# Patient Record
Sex: Female | Born: 1955 | Race: White | Hispanic: No | Marital: Married | Smoking: Never smoker
Health system: Southern US, Community
[De-identification: ages and names within clinical notes are randomized; demographics above are authoritative.]

## PROBLEM LIST (undated history)

## (undated) DIAGNOSIS — E785 Hyperlipidemia, unspecified: Secondary | ICD-10-CM

## (undated) DIAGNOSIS — K635 Polyp of colon: Secondary | ICD-10-CM

## (undated) HISTORY — PX: OTHER SURGICAL HISTORY: SHX169

## (undated) HISTORY — PX: TONSILLECTOMY: SUR1361

## (undated) HISTORY — DX: Polyp of colon: K63.5

## (undated) HISTORY — PX: ABDOMINAL HYSTERECTOMY: SHX81

## (undated) HISTORY — PX: OOPHORECTOMY: SHX86

## (undated) HISTORY — DX: Hyperlipidemia, unspecified: E78.5

---

## 2001-03-14 ENCOUNTER — Other Ambulatory Visit: Admission: RE | Admit: 2001-03-14 | Discharge: 2001-03-14 | Payer: Self-pay | Admitting: Obstetrics and Gynecology

## 2001-03-21 ENCOUNTER — Encounter: Admission: RE | Admit: 2001-03-21 | Discharge: 2001-03-21 | Payer: Self-pay | Admitting: Obstetrics and Gynecology

## 2001-03-21 ENCOUNTER — Encounter: Payer: Self-pay | Admitting: Obstetrics and Gynecology

## 2003-11-12 ENCOUNTER — Encounter: Admission: RE | Admit: 2003-11-12 | Discharge: 2003-11-12 | Payer: Self-pay | Admitting: Emergency Medicine

## 2003-11-13 ENCOUNTER — Encounter: Admission: RE | Admit: 2003-11-13 | Discharge: 2003-11-13 | Payer: Self-pay | Admitting: Emergency Medicine

## 2005-01-12 ENCOUNTER — Encounter: Admission: RE | Admit: 2005-01-12 | Discharge: 2005-01-12 | Payer: Self-pay | Admitting: Emergency Medicine

## 2005-02-23 ENCOUNTER — Encounter (INDEPENDENT_AMBULATORY_CARE_PROVIDER_SITE_OTHER): Payer: Self-pay | Admitting: *Deleted

## 2005-02-23 ENCOUNTER — Ambulatory Visit (HOSPITAL_COMMUNITY): Admission: RE | Admit: 2005-02-23 | Discharge: 2005-02-23 | Payer: Self-pay | Admitting: Gastroenterology

## 2007-08-01 ENCOUNTER — Encounter: Admission: RE | Admit: 2007-08-01 | Discharge: 2007-08-01 | Payer: Self-pay | Admitting: Emergency Medicine

## 2007-08-02 ENCOUNTER — Encounter: Admission: RE | Admit: 2007-08-02 | Discharge: 2007-08-02 | Payer: Self-pay | Admitting: Emergency Medicine

## 2010-03-11 ENCOUNTER — Encounter: Admission: RE | Admit: 2010-03-11 | Discharge: 2010-03-11 | Payer: Self-pay | Admitting: Family Medicine

## 2010-03-17 ENCOUNTER — Ambulatory Visit (HOSPITAL_COMMUNITY): Admission: RE | Admit: 2010-03-17 | Discharge: 2010-03-18 | Payer: Self-pay | Admitting: General Surgery

## 2010-07-30 LAB — COMPREHENSIVE METABOLIC PANEL
AST: 29 U/L (ref 0–37)
Albumin: 4.1 g/dL (ref 3.5–5.2)
Alkaline Phosphatase: 62 U/L (ref 39–117)
CO2: 30 mEq/L (ref 19–32)
Calcium: 9.2 mg/dL (ref 8.4–10.5)
Creatinine, Ser: 0.94 mg/dL (ref 0.4–1.2)
GFR calc Af Amer: >60 mL/min (ref >60)
GFR calc non Af Amer: >60 mL/min (ref >60)
Glucose, Bld: 82 mg/dL (ref 70–99)
Sodium: 140 mEq/L (ref 135–145)
Total Protein: 6.3 g/dL (ref 6.0–8.3)

## 2010-07-30 LAB — DIFFERENTIAL
Basophils Absolute: 0 10*3/uL (ref 0.0–0.1)
Basophils Relative: 1 % (ref 0–1)
Eosinophils Absolute: 0.1 10*3/uL (ref 0.0–0.7)
Lymphocytes Relative: 33 % (ref 12–46)
Monocytes Relative: 7 % (ref 3–12)
Neutro Abs: 3.4 10*3/uL (ref 1.7–7.7)

## 2010-07-30 LAB — CBC
Hemoglobin: 13.4 g/dL (ref 12.0–15.0)
MCH: 33.5 pg (ref 26.0–34.0)
MCHC: 34.6 g/dL (ref 30.0–36.0)
MCV: 96.8 fL (ref 78.0–100.0)
Platelets: 255 10*3/uL (ref 150–400)
RBC: 4 MIL/uL (ref 3.87–5.11)

## 2010-10-03 NOTE — Op Note (Signed)
NAMEALANEY, Christina Caldwell               ACCOUNT NO.:  [id number]   MEDICAL RECORD NO.:  [id number]          PATIENT TYPE:  AMB   LOCATION:  ENDO                         FACILITY:  [facility]   PHYSICIAN:  Anselmo Rod, M.D.  DATE OF BIRTH:  Nov 20, 1955   DATE OF PROCEDURE:  02/27/2005  DATE OF DISCHARGE:  02/23/2005                                 OPERATIVE REPORT   PROCEDURE:  Colonoscopy with snare polypectomy x1.   ENDOSCOPIST:  Anselmo Rod, M.D.   INSTRUMENT USED:  Olympus video colonoscope.   INDICATIONS FOR PROCEDURE:  This 55 year old female underwent a screening  colonoscopy to rule out colonic polyps, masses, etc.   PRE-PROCEDURE PREPARATION:  An informed consent was procured from the  patient.  The patient was fasted for eight hours prior to the procedure and  prepped with Osmo-Prep pills the night or in the morning of the procedure.  The risks and benefits of the procedure, including a 10% miss rate of cancer  and polyps, was discussed with her as well.   PRE-PROCEDURE PHYSICAL EXAMINATION:  VITAL SIGNS:  Stable.  NECK:  Supple.  CHEST:  Clear to auscultation.  HEART:  S1 and S2 regular.  ABDOMEN:  Soft, with normal bowel sounds.   DESCRIPTION OF PROCEDURE:  The patient was placed in the left lateral  decubitus position and sedated with 60 mg of Demerol and 6 mg of Versed in  slow incremental doses.  Once the patient was adequately sedated and  maintained on low-flow oxygen and continuous cardiac monitoring, the Olympus  video colonoscope was advanced from the rectum to the cecum.  A small  sessile polyp was removed by a hot snare from 80 cm.  The rest of the exam  up to the terminal ileum was normal.   IMPRESSION:  1.  Small sessile polyp, removed by a hot snare from 80 cm.  2.  Otherwise normal colonoscopy up to the terminal ileum.  No large masses,      polyps or diverticula were seen.   RECOMMENDATIONS:  1.  Continue a high-fiber diet with liberal fluid  intake.  2.  Await pathology results.  3.  Avoid non-steroidals including aspirin for the next two weeks.  4.  Repeat colonoscopy, depending upon the pathology results.  5.  Outpatient followup as the need arises in the future.      Anselmo Rod, M.D.  Electronically Signed     JNM/MEDQ  D:  02/27/2005  T:  02/27/2005  Job:  [id number]   cc:   Reuben Likes, M.D.  Fax: [phone]

## 2012-02-09 ENCOUNTER — Other Ambulatory Visit: Payer: Self-pay | Admitting: Family Medicine

## 2012-02-09 DIAGNOSIS — Z1231 Encounter for screening mammogram for malignant neoplasm of breast: Secondary | ICD-10-CM

## 2012-02-23 ENCOUNTER — Ambulatory Visit: Payer: Self-pay

## 2012-03-08 ENCOUNTER — Ambulatory Visit
Admission: RE | Admit: 2012-03-08 | Discharge: 2012-03-08 | Disposition: A | Discharge status: Home / Self Care | Payer: PRIVATE HEALTH INSURANCE | Source: Ambulatory Visit | Attending: Family Medicine | Admitting: Family Medicine

## 2012-03-08 DIAGNOSIS — Z1231 Encounter for screening mammogram for malignant neoplasm of breast: Secondary | ICD-10-CM

## 2012-11-07 ENCOUNTER — Encounter: Payer: Self-pay | Admitting: Women's Health

## 2012-11-07 ENCOUNTER — Ambulatory Visit (INDEPENDENT_AMBULATORY_CARE_PROVIDER_SITE_OTHER): Payer: PRIVATE HEALTH INSURANCE | Admitting: Women's Health

## 2012-11-07 VITALS — BP 130/80 | Ht 65.5 in | Wt 198.0 lb

## 2012-11-07 DIAGNOSIS — Z780 Asymptomatic menopausal state: Secondary | ICD-10-CM

## 2012-11-07 DIAGNOSIS — N898 Other specified noninflammatory disorders of vagina: Secondary | ICD-10-CM

## 2012-11-07 DIAGNOSIS — Z01419 Encounter for gynecological examination (general) (routine) without abnormal findings: Secondary | ICD-10-CM

## 2012-11-07 DIAGNOSIS — N952 Postmenopausal atrophic vaginitis: Secondary | ICD-10-CM | POA: Insufficient documentation

## 2012-11-07 DIAGNOSIS — N899 Noninflammatory disorder of vagina, unspecified: Secondary | ICD-10-CM

## 2012-11-07 LAB — WET PREP FOR TRICH, YEAST, CLUE
Clue Cells Wet Prep HPF POC: NONE SEEN
Trich, Wet Prep: NONE SEEN
Yeast Wet Prep HPF POC: NONE SEEN

## 2012-11-07 NOTE — Progress Notes (Signed)
Christina Caldwell Jan 13, 1956 [id number]    History:    The patient presents for annual exam.  Postmenopausal no HRT. Hysterectomy from uterine rupture with pregnancy.  Normal Pap and mammogram history. Negative polyp on colonoscopy at age 57 was instructed to repeat colonoscopy at 75.   Past medical history, past surgical history, family history and social history were all reviewed and documented in the EPIC chart. Cholecystectomy 02/2010. 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 Husband currently out of work. Son died of Ewing's sarcoma at 25. Has an adopted son who was in the [employer], now out with some injuries.   ROS:  A  ROS was performed and pertinent positives and negatives are included in the history.  Exam:  Filed Vitals:   11/07/12 1438  BP: 130/80    General appearance:  Normal Head/Neck:  Normal, without cervical or supraclavicular adenopathy. Thyroid:  Symmetrical, normal in size, without palpable masses or nodularity. Respiratory  Effort:  Normal  Auscultation:  Clear without wheezing or rhonchi Cardiovascular  Auscultation:  Regular rate, without rubs, murmurs or gallops  Edema/varicosities:  Not grossly evident Abdominal  Soft,nontender, without masses, guarding or rebound.  Liver/spleen:  No organomegaly noted  Hernia:  None appreciated  Skin  Inspection:  Grossly normal  Palpation:  Grossly normal Neurologic/psychiatric  Orientation:  Normal with appropriate conversation.  Mood/affect:  Normal  Genitourinary    Breasts: Examined lying and sitting.     Right: Without masses, retractions, discharge or axillary adenopathy.     Left: Without masses, retractions, discharge or axillary adenopathy.   Inguinal/mons:  Normal without inguinal adenopathy  External genitalia:  Normal  BUS/Urethra/Skene's glands:  Normal  Bladder:  Normal  Vagina:  atrophic  Cervix:  absent Uterus: absent  Adnexa/parametria:     Rt: Without masses or  tenderness.   Lt: Without masses or tenderness.  Anus and perineum: Normal  Digital rectal exam: Normal sphincter tone without palpated masses or tenderness  Assessment/Plan:  57 y.o. M WF G2 P1 for annual exam with vaginal burning and irritation especially with intercourse.  Vaginal atrophy Obesity  Plan: Continue care and labs with primary care. SBE's, annual mammogram, calcium rich diet, vitamin D 2000 daily.  DEXA will schedule here. Options reviewed for vaginal atrophy, Vagifem one applicator at bedtime for 2 weeks and then twice weekly for several weeks, vaginal lubricants encouraged states does not want to use long term samples given will call if would like a refill. Reviewed minimal systemic absorption, slight risk for blood clots and strokes. Increase regular exercise, decrease calories for weight loss for health.    Harrington Challenger [facility], 4:04 PM 11/07/2012

## 2012-11-07 NOTE — Patient Instructions (Signed)
Health Recommendations for Postmenopausal Women Respected and ongoing research has looked at the most common causes of death, disability, and poor quality of life in postmenopausal women. The causes include heart disease, diseases of blood vessels, diabetes, depression, cancer, and bone loss (osteoporosis). Many things can be done to help lower the chances of developing these and other common problems: CARDIOVASCULAR DISEASE Heart Disease: A heart attack is a medical emergency. Know the signs and symptoms of a heart attack. Below are things women can do to reduce their risk for heart disease.   Do not smoke. If you smoke, quit.  Aim for a healthy weight. Being overweight causes many preventable deaths. Eat a healthy and balanced diet and drink an adequate amount of liquids.  Get moving. Make a commitment to be more physically active. Aim for 30 minutes of activity on most, if not all days of the week.  Eat for heart health. Choose a diet that is low in saturated fat and cholesterol and eliminate trans fat. Include whole grains, vegetables, and fruits. Read and understand the labels on food containers before buying.  Know your numbers. Ask your caregiver to check your blood pressure, cholesterol (total, HDL, LDL, triglycerides) and blood glucose. Work with your caregiver on improving your entire clinical picture.  High blood pressure. Limit or stop your table salt intake (try salt substitute and food seasonings). Avoid salty foods and drinks. Read labels on food containers before buying. Eating well and exercising can help control high blood pressure. STROKE  Stroke is a medical emergency. Stroke may be the result of a blood clot in a blood vessel in the brain or by a brain hemorrhage (bleeding). Know the signs and symptoms of a stroke. To lower the risk of developing a stroke:  Avoid fatty foods.  Quit smoking.  Control your diabetes, blood pressure, and irregular heart rate. THROMBOPHLEBITIS  (BLOOD CLOT) OF THE LEG  Becoming overweight and leading a stationary lifestyle may also contribute to developing blood clots. Controlling your diet and exercising will help lower the risk of developing blood clots. CANCER SCREENING  Breast Cancer: Take steps to reduce your risk of breast cancer.  You should practice "breast self-awareness." This means understanding the normal appearance and feel of your breasts and should include breast self-examination. Any changes detected, no matter how small, should be reported to your caregiver.  After age 40, you should have a clinical breast exam (CBE) every year.  Starting at age 40, you should consider having a mammogram (breast X-ray) every year.  If you have a family history of breast cancer, talk to your caregiver about genetic screening.  If you are at high risk for breast cancer, talk to your caregiver about having an MRI and a mammogram every year.  Intestinal or Stomach Cancer: Tests to consider are a rectal exam, fecal occult blood, sigmoidoscopy, and colonoscopy. Women who are high risk may need to be screened at an earlier age and more often.  Cervical Cancer:  Beginning at age 30, you should have a Pap test every 3 years as long as the past 3 Pap tests have been normal.  If you have had past treatment for cervical cancer or a condition that could lead to cancer, you need Pap tests and screening for cancer for at least 20 years after your treatment.  If you had a hysterectomy for a problem that was not cancer or a condition that could lead to cancer, then you no longer need Pap tests.    If you are between ages 65 and 70, and you have had normal Pap tests going back 10 years, you no longer need Pap tests.  If Pap tests have been discontinued, risk factors (such as a new sexual partner) need to be reassessed to determine if screening should be resumed.  Some medical problems can increase the chance of getting cervical cancer. In these  cases, your caregiver may recommend more frequent screening and Pap tests.  Uterine Cancer: If you have vaginal bleeding after reaching menopause, you should notify your caregiver.  Ovarian cancer: Other than yearly pelvic exams, there are no reliable tests available to screen for ovarian cancer at this time except for yearly pelvic exams.  Lung Cancer: Yearly chest X-rays can detect lung cancer and should be done on high risk women, such as cigarette smokers and women with chronic lung disease (emphysema).  Skin Cancer: A complete body skin exam should be done at your yearly examination. Avoid overexposure to the sun and ultraviolet light lamps. Use a strong sun block cream when in the sun. All of these things are important in lowering the risk of skin cancer. MENOPAUSE Menopause Symptoms: Hormone therapy products are effective for treating symptoms associated with menopause:  Moderate to severe hot flashes.  Night sweats.  Mood swings.  Headaches.  Tiredness.  Loss of sex drive.  Insomnia.  Other symptoms. Hormone replacement carries certain risks, especially in older women. Women who use or are thinking about using estrogen or estrogen with progestin treatments should discuss that with their caregiver. Your caregiver will help you understand the benefits and risks. The ideal dose of hormone replacement therapy is not known. The Food and Drug Administration (FDA) has concluded that hormone therapy should be used only at the lowest doses and for the shortest amount of time to reach treatment goals.  OSTEOPOROSIS Protecting Against Bone Loss and Preventing Fracture: If you use hormone therapy for prevention of bone loss (osteoporosis), the risks for bone loss must outweigh the risk of the therapy. Ask your caregiver about other medications known to be safe and effective for preventing bone loss and fractures. To guard against bone loss or fractures, the following is recommended:  If  you are less than age 50, take 1000 mg of calcium and at least 600 mg of Vitamin D per day.  If you are greater than age 50 but less than age 70, take 1200 mg of calcium and at least 600 mg of Vitamin D per day.  If you are greater than age 70, take 1200 mg of calcium and at least 800 mg of Vitamin D per day. Smoking and excessive alcohol intake increases the risk of osteoporosis. Eat foods rich in calcium and vitamin D and do weight bearing exercises several times a week as your caregiver suggests. DIABETES Diabetes Melitus: If you have Type I or Type 2 diabetes, you should keep your blood sugar under control with diet, exercise and recommended medication. Avoid too many sweets, starchy and fatty foods. Being overweight can make control more difficult. COGNITION AND MEMORY Cognition and Memory: Menopausal hormone therapy is not recommended for the prevention of cognitive disorders such as Alzheimer's disease or memory loss.  DEPRESSION  Depression may occur at any age, but is common in elderly women. The reasons may be because of physical, medical, social (loneliness), or financial problems and needs. If you are experiencing depression because of medical problems and control of symptoms, talk to your caregiver about this. Physical activity and   exercise may help with mood and sleep. Community and volunteer involvement may help your sense of value and worth. If you have depression and you feel that the problem is getting worse or becoming severe, talk to your caregiver about treatment options that are best for you. ACCIDENTS  Accidents are common and can be serious in the elderly woman. Prepare your house to prevent accidents. Eliminate throw rugs, place hand bars in the bath, shower and toilet areas. Avoid wearing high heeled shoes or walking on wet, snowy, and icy areas. Limit or stop driving if you have vision or hearing problems, or you feel you are unsteady with you movements and  reflexes. HEPATITIS C Hepatitis C is a type of viral infection affecting the liver. It is spread mainly through contact with blood from an infected person. It can be treated, but if left untreated, it can lead to severe liver damage over years. Many people who are infected do not know that the virus is in their blood. If you are a "baby-boomer", it is recommended that you have one screening test for Hepatitis C. IMMUNIZATIONS  Several immunizations are important to consider having during your senior years, including:   Tetanus, diptheria, and pertussis booster shot.  Influenza every year before the flu season begins.  Pneumonia vaccine.  Shingles vaccine.  Others as indicated based on your specific needs. Talk to your caregiver about these. Document Released: 06/26/2005 Document Revised: 04/20/2012 Document Reviewed: 02/20/2008 Exit[employer] Patient Information 2014 [employer].  

## 2014-03-19 ENCOUNTER — Encounter: Payer: Self-pay | Admitting: Women's Health

## 2014-10-16 ENCOUNTER — Other Ambulatory Visit: Payer: Self-pay

## 2014-10-16 DIAGNOSIS — Z1231 Encounter for screening mammogram for malignant neoplasm of breast: Secondary | ICD-10-CM

## 2014-10-22 ENCOUNTER — Encounter (INDEPENDENT_AMBULATORY_CARE_PROVIDER_SITE_OTHER): Payer: Self-pay

## 2014-10-22 ENCOUNTER — Ambulatory Visit
Admission: RE | Admit: 2014-10-22 | Discharge: 2014-10-22 | Disposition: A | Discharge status: Home / Self Care | Payer: PRIVATE HEALTH INSURANCE | Source: Ambulatory Visit

## 2014-10-22 DIAGNOSIS — Z1231 Encounter for screening mammogram for malignant neoplasm of breast: Secondary | ICD-10-CM

## 2015-09-30 ENCOUNTER — Other Ambulatory Visit: Payer: Self-pay

## 2015-09-30 DIAGNOSIS — Z1231 Encounter for screening mammogram for malignant neoplasm of breast: Secondary | ICD-10-CM

## 2015-11-15 ENCOUNTER — Ambulatory Visit
Admission: RE | Admit: 2015-11-15 | Discharge: 2015-11-15 | Disposition: A | Discharge status: Home / Self Care | Payer: PRIVATE HEALTH INSURANCE | Source: Ambulatory Visit

## 2015-11-15 DIAGNOSIS — Z1231 Encounter for screening mammogram for malignant neoplasm of breast: Secondary | ICD-10-CM

## 2017-11-10 ENCOUNTER — Other Ambulatory Visit: Payer: Self-pay | Admitting: Family Medicine

## 2017-11-10 DIAGNOSIS — Z1231 Encounter for screening mammogram for malignant neoplasm of breast: Secondary | ICD-10-CM

## 2017-12-02 ENCOUNTER — Ambulatory Visit
Admission: RE | Admit: 2017-12-02 | Discharge: 2017-12-02 | Disposition: A | Discharge status: Home / Self Care | Payer: Self-pay | Source: Ambulatory Visit | Attending: Family Medicine | Admitting: Family Medicine

## 2017-12-02 DIAGNOSIS — Z1231 Encounter for screening mammogram for malignant neoplasm of breast: Secondary | ICD-10-CM

## 2019-08-15 ENCOUNTER — Ambulatory Visit: Payer: Self-pay | Admitting: Family Medicine

## 2019-08-15 ENCOUNTER — Encounter: Payer: Self-pay | Admitting: Family Medicine

## 2019-08-15 VITALS — BP 120/78 | HR 68 | Ht 66.0 in | Wt 205.0 lb

## 2019-08-15 DIAGNOSIS — Z789 Other specified health status: Secondary | ICD-10-CM

## 2019-08-15 NOTE — Progress Notes (Signed)
  Subjective:     Patient ID: Christina Caldwell, female   DOB: [birth year], 64 y.o.   MRN: [id number]  HPI Christina Caldwell [facility] for her required wellness visit for her insurance. Her PCP is Dr. Sigmund Hazel, she is scheduled for her yearly physical in June per patient. She is due for her mammogram. She states she is also due for an eye exam and a skin cancer screening. She reports that she did the cologuard screening 5 years ago. She has had a hysterectomy, so no pap smear is needed. She is a non-smoker. She states she is very active at work, could do better at eating healthier options. Struggles with sleep at times d/t arthritis pain, but does not like to take any medications for this unless absolutely necessary. She denies any problems or concerns today.  Past Medical History:  Diagnosis Date  . Colon polyps    Allergies  Allergen Reactions  . Benadryl [Diphenhydramine Hcl]     Current Outpatient Medications:  Marland Kitchen  Vitamin D, Ergocalciferol, (DRISDOL) 1.25 MG (50000 UNIT) CAPS capsule, Take 50,000 Units by mouth 2 (two) times a week., Disp: , Rfl:     Review of Systems  Constitutional: Negative for chills, fatigue, fever and unexpected weight change.  HENT: Negative for congestion, ear pain, sinus pressure, sinus pain and sore throat.   Eyes: Negative for discharge and visual disturbance.  Respiratory: Negative for cough, shortness of breath and wheezing.   Cardiovascular: Negative for chest pain and leg swelling.  Gastrointestinal: Negative for abdominal pain, blood in stool, constipation, diarrhea, nausea and vomiting.  Genitourinary: Negative for difficulty urinating and hematuria.  Skin: Negative for color change.  Neurological: Negative for dizziness, weakness, light-headedness and headaches.  Hematological: Negative for adenopathy.  All other systems reviewed and are negative.      Objective:   Physical Exam Vitals reviewed.  Constitutional:    General: She is not in acute distress.    Appearance: Normal appearance. She is well-developed.  HENT:     Head: Normocephalic and atraumatic.  Eyes:     General:        Right eye: No discharge.        Left eye: No discharge.  Cardiovascular:     Rate and Rhythm: Normal rate and regular rhythm.     Heart sounds: Normal heart sounds.  Pulmonary:     Effort: Pulmonary effort is normal. No respiratory distress.     Breath sounds: Normal breath sounds.  Musculoskeletal:     Cervical back: Neck supple.  Skin:    General: Skin is warm and dry.  Neurological:     Mental Status: She is alert and oriented to person, place, and time.  Psychiatric:        Mood and Affect: Mood normal.        Behavior: Behavior normal.    Today's Vitals   08/15/19 1401  BP: 120/78  Pulse: 68  SpO2: 98%  Weight: 205 lb (93 kg)  Height: 5\' 6"  (1.676 m)   Body mass index is 33.09 kg/m.      Assessment:     Participant in health and wellness plan      Plan:     1. Keep all regular appts with PCP. Ensure mammogram and eye exam are scheduled for this year.  2. Encouraged efforts at healthy eating and increasing physical activity for weight loss. 3. F/u here prn.

## 2019-09-08 ENCOUNTER — Other Ambulatory Visit: Payer: Self-pay | Admitting: Family Medicine

## 2019-09-08 DIAGNOSIS — Z1231 Encounter for screening mammogram for malignant neoplasm of breast: Secondary | ICD-10-CM

## 2019-09-14 ENCOUNTER — Ambulatory Visit
Admission: RE | Admit: 2019-09-14 | Discharge: 2019-09-14 | Disposition: A | Discharge status: Home / Self Care | Payer: PRIVATE HEALTH INSURANCE | Source: Ambulatory Visit | Attending: Family Medicine | Admitting: Family Medicine

## 2019-09-14 ENCOUNTER — Other Ambulatory Visit: Payer: Self-pay

## 2019-09-14 DIAGNOSIS — Z1231 Encounter for screening mammogram for malignant neoplasm of breast: Secondary | ICD-10-CM

## 2019-10-26 ENCOUNTER — Other Ambulatory Visit: Payer: Self-pay | Admitting: Family Medicine

## 2019-10-26 DIAGNOSIS — E2839 Other primary ovarian failure: Secondary | ICD-10-CM

## 2020-01-12 ENCOUNTER — Ambulatory Visit
Admission: RE | Admit: 2020-01-12 | Discharge: 2020-01-12 | Disposition: A | Discharge status: Home / Self Care | Payer: PRIVATE HEALTH INSURANCE | Source: Ambulatory Visit | Attending: Family Medicine | Admitting: Family Medicine

## 2020-01-12 ENCOUNTER — Other Ambulatory Visit: Payer: Self-pay

## 2020-01-12 DIAGNOSIS — E2839 Other primary ovarian failure: Secondary | ICD-10-CM

## 2020-12-17 IMAGING — MG DIGITAL SCREENING BILAT W/ CAD
5 series · 5 of 5 positions shown · non-contrast
Comparison: Previous exam(s).

CLINICAL DATA: Screening.

EXAM:
DIGITAL SCREENING BILATERAL MAMMOGRAM WITH CAD

[R CC (1 of 2)]
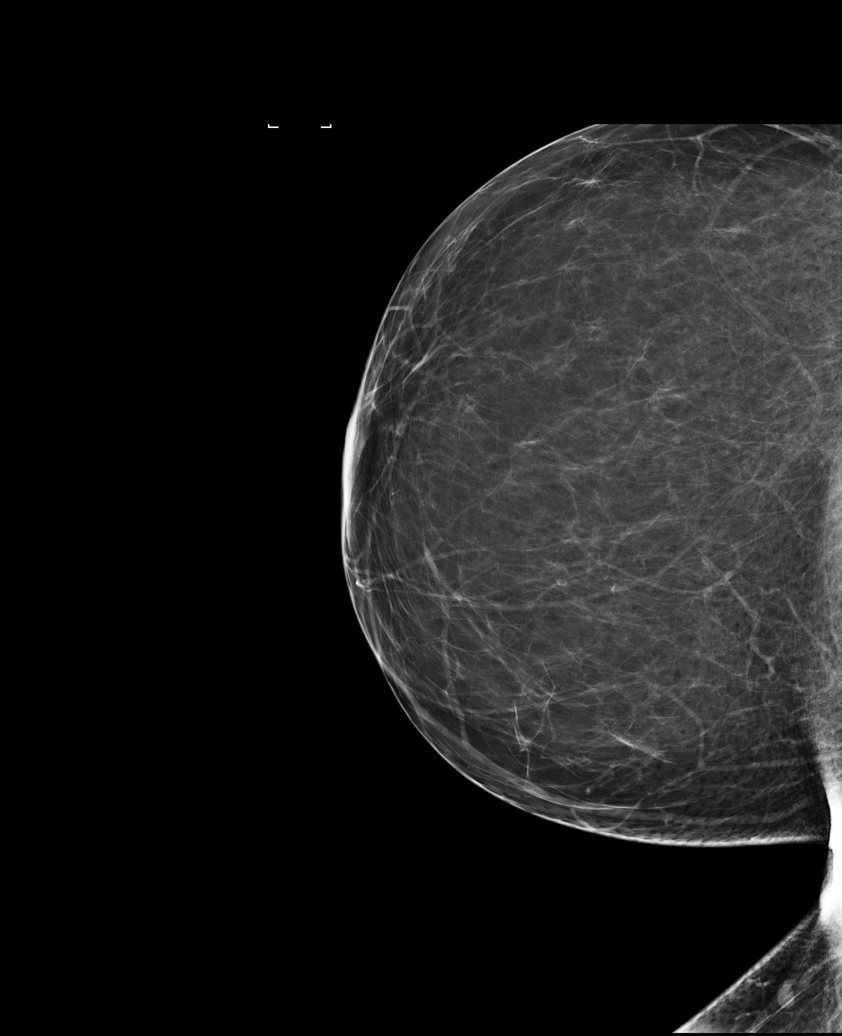

[R MLO]
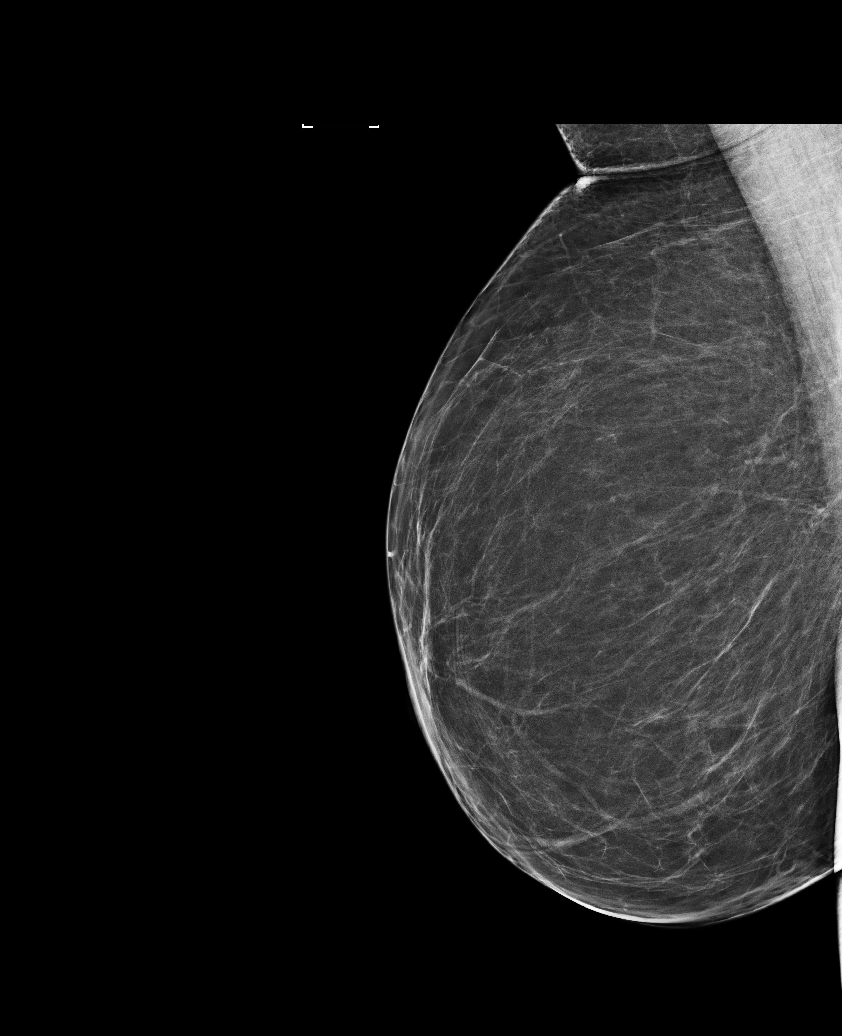

[L CC]
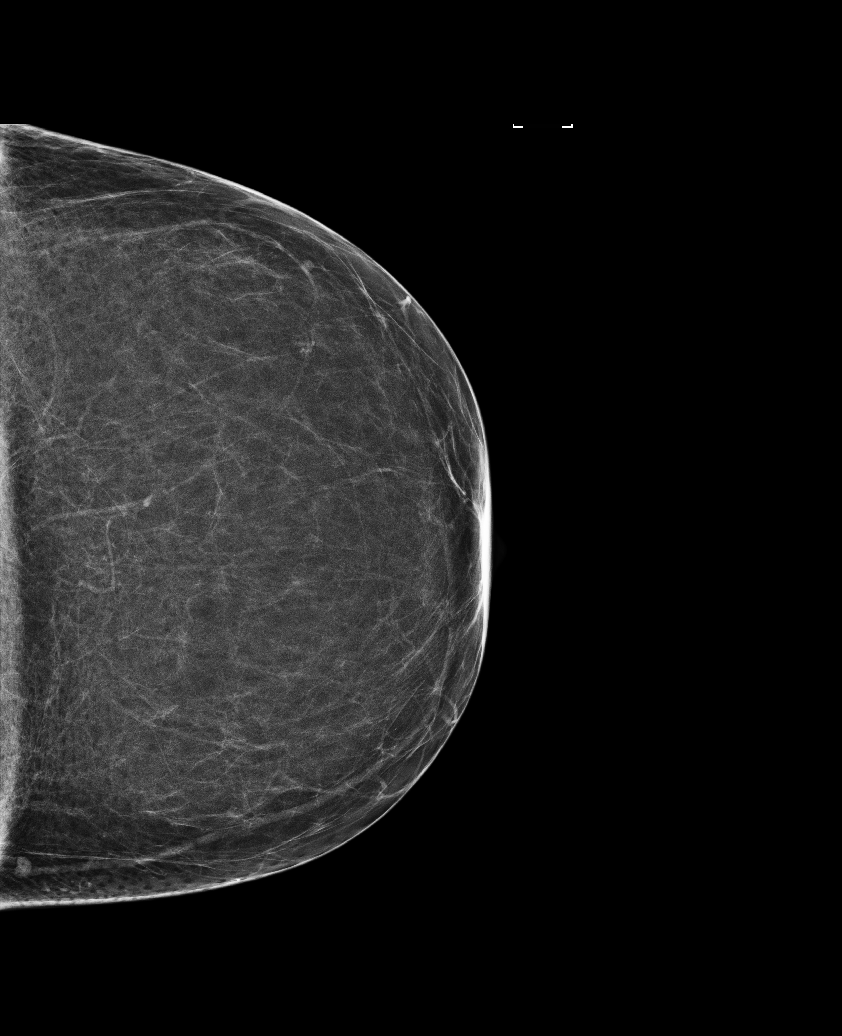

[R CC (2 of 2)]
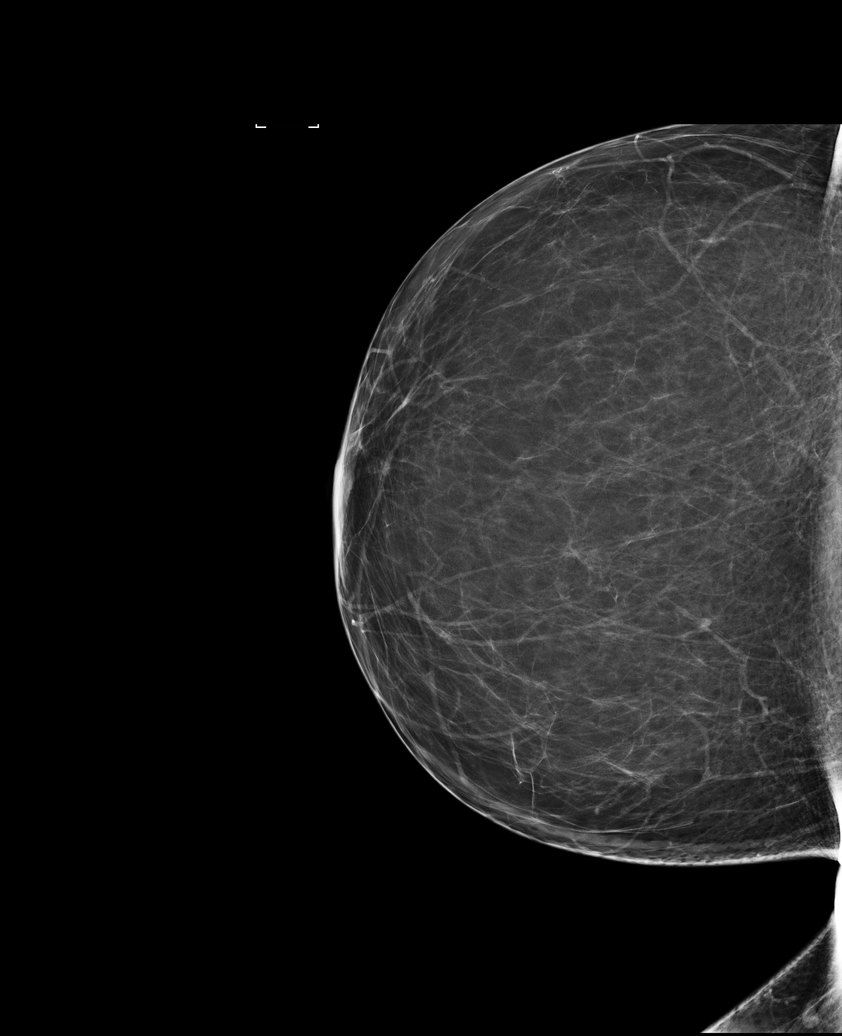

[L MLO]
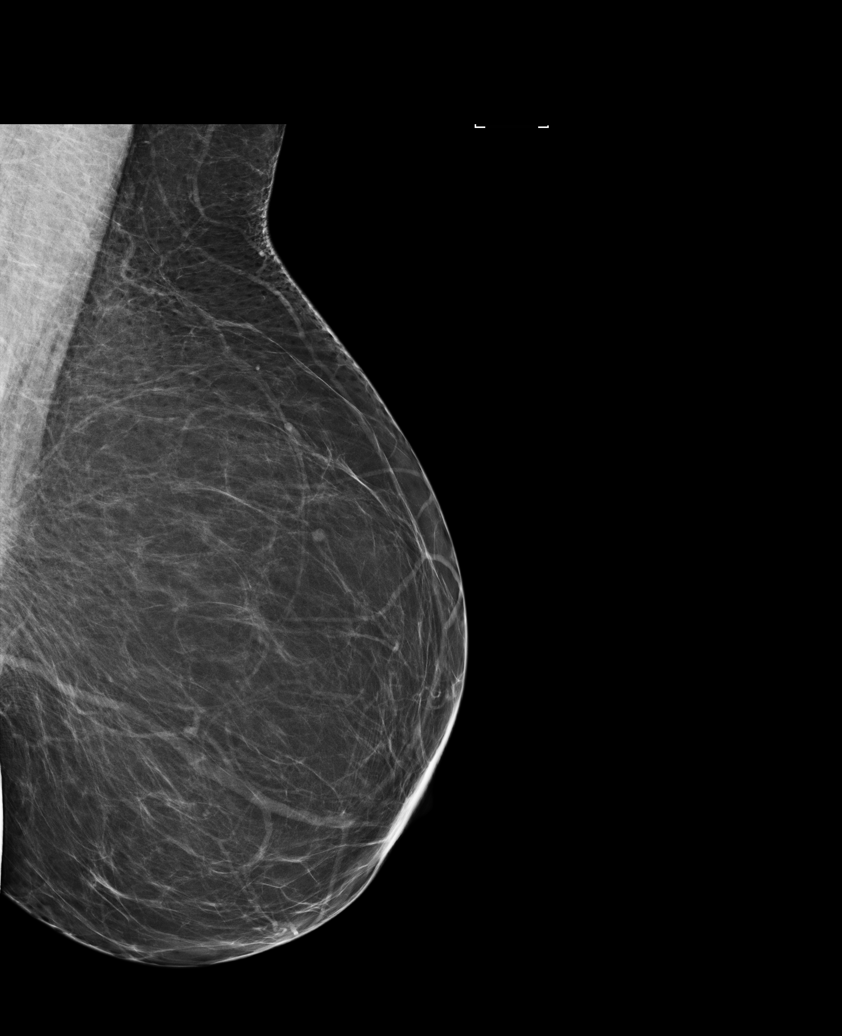

[5 of 5 positions shown; findings below may reference images not displayed]

ACR Breast Density Category b: There are scattered areas of
fibroglandular density.
FINDINGS: There are no findings suspicious for malignancy. Images were
processed with CAD.
IMPRESSION: No mammographic evidence of malignancy. A result letter of this
screening mammogram will be mailed directly to the patient.

RECOMMENDATION:
Screening mammogram in one year. (Code:AS-G-LCT)

BI-RADS CATEGORY  1: Negative.

## 2021-01-09 ENCOUNTER — Encounter (HOSPITAL_BASED_OUTPATIENT_CLINIC_OR_DEPARTMENT_OTHER): Payer: Self-pay | Admitting: Emergency Medicine

## 2021-01-09 ENCOUNTER — Other Ambulatory Visit: Payer: Self-pay

## 2021-01-09 ENCOUNTER — Emergency Department (HOSPITAL_BASED_OUTPATIENT_CLINIC_OR_DEPARTMENT_OTHER)
Admission: EM | Admit: 2021-01-09 | Discharge: 2021-01-09 | Disposition: A | Discharge status: Home / Self Care | Payer: PRIVATE HEALTH INSURANCE | Attending: Emergency Medicine | Admitting: Emergency Medicine

## 2021-01-09 ENCOUNTER — Emergency Department (HOSPITAL_BASED_OUTPATIENT_CLINIC_OR_DEPARTMENT_OTHER): Payer: PRIVATE HEALTH INSURANCE | Admitting: Radiology

## 2021-01-09 DIAGNOSIS — R61 Generalized hyperhidrosis: Secondary | ICD-10-CM | POA: Diagnosis not present

## 2021-01-09 DIAGNOSIS — R12 Heartburn: Secondary | ICD-10-CM | POA: Insufficient documentation

## 2021-01-09 DIAGNOSIS — R0789 Other chest pain: Secondary | ICD-10-CM | POA: Insufficient documentation

## 2021-01-09 DIAGNOSIS — I160 Hypertensive urgency: Secondary | ICD-10-CM | POA: Diagnosis not present

## 2021-01-09 DIAGNOSIS — R079 Chest pain, unspecified: Secondary | ICD-10-CM

## 2021-01-09 LAB — CBC
HCT: 39.8 % (ref 36.0–46.0)
Hemoglobin: 13.1 g/dL (ref 12.0–15.0)
MCH: 30.2 pg (ref 26.0–34.0)
MCHC: 32.9 g/dL (ref 30.0–36.0)
MCV: 91.7 fL (ref 80.0–100.0)
Platelets: 273 10*3/uL (ref 150–400)
RBC: 4.34 MIL/uL (ref 3.87–5.11)
RDW: 14.8 % (ref 11.5–15.5)
WBC: 6.5 10*3/uL (ref 4.0–10.5)
nRBC: 0 % (ref 0.0–0.2)

## 2021-01-09 LAB — BASIC METABOLIC PANEL
Anion gap: 8 (ref 5–15)
BUN: 19 mg/dL (ref 8–23)
CO2: 28 mmol/L (ref 22–32)
Calcium: 8.9 mg/dL (ref 8.9–10.3)
Chloride: 103 mmol/L (ref 98–111)
Creatinine, Ser: 0.83 mg/dL (ref 0.44–1.00)
GFR, Estimated: >60 mL/min (ref >60)
Glucose, Bld: 100 mg/dL — ABNORMAL HIGH (ref 70–99)
Potassium: 3.8 mmol/L (ref 3.5–5.1)
Sodium: 139 mmol/L (ref 135–145)

## 2021-01-09 LAB — BRAIN NATRIURETIC PEPTIDE: B Natriuretic Peptide: 135.6 pg/mL — ABNORMAL HIGH (ref 0.0–100.0)

## 2021-01-09 LAB — TROPONIN I (HIGH SENSITIVITY)
Troponin I (High Sensitivity): 3 ng/L (ref <18)
Troponin I (High Sensitivity): 4 ng/L (ref <18)

## 2021-01-09 MED ORDER — ALUM & MAG HYDROXIDE-SIMETH 200-200-20 MG/5ML PO SUSP
30.0000 mL | Freq: Once | ORAL | Status: AC
  Administered 2021-01-09: 30 mL via ORAL
  Filled 2021-01-09: qty 30

## 2021-01-09 MED ORDER — NITROGLYCERIN 0.4 MG SL SUBL
0.4000 mg | SUBLINGUAL_TABLET | Freq: Once | SUBLINGUAL | Status: AC
  Administered 2021-01-09: 0.4 mg via SUBLINGUAL
  Filled 2021-01-09: qty 1

## 2021-01-09 MED ORDER — ACETAMINOPHEN 500 MG PO TABS
1000.0000 mg | ORAL_TABLET | Freq: Once | ORAL | Status: AC
  Administered 2021-01-09: 1000 mg via ORAL
  Filled 2021-01-09: qty 2

## 2021-01-09 MED ORDER — ASPIRIN 325 MG PO TABS
325.0000 mg | ORAL_TABLET | Freq: Every day | ORAL | Status: DC
  Administered 2021-01-09: 325 mg via ORAL
  Filled 2021-01-09: qty 1

## 2021-01-09 MED ORDER — LIDOCAINE VISCOUS HCL 2 % MT SOLN
15.0000 mL | Freq: Once | OROMUCOSAL | Status: AC
  Administered 2021-01-09: 15 mL via ORAL
  Filled 2021-01-09: qty 15

## 2021-01-09 NOTE — Discharge Instructions (Addendum)
You were evaluated in the Emergency Department and after careful evaluation, we did not find any emergent condition requiring admission or further testing in the hospital.  Your exam/testing today was overall reassuring.  Please return to the Emergency Department if you experience any worsening of your condition.  Thank you for allowing us to be a part of your care.  

## 2021-01-09 NOTE — ED Notes (Signed)
1 nitro sublingual given. Pt reports chest pain 2.

## 2021-01-09 NOTE — ED Notes (Signed)
Dc home , stated understanding of dc instructions.

## 2021-01-09 NOTE — ED Triage Notes (Addendum)
Pt presents to ED Pov. Pt c/o L Cp that radiates towards L shoulder. Pt reports that it began last night. Also c/o being clammy today w/ pain today. Pt went to PCP and had EKG which showed changes from last. Advised to go here. Also reports increased stress in life

## 2021-01-09 NOTE — ED Notes (Signed)
Pt states she now has pain of 1.

## 2021-01-09 NOTE — ED Provider Notes (Signed)
[facility] EMERGENCY DEPT Provider Note   CSN: [id number] Arrival date & time: 01/09/21  1126     History Chief Complaint  Patient presents with   Chest Pain    Christina Caldwell is a 65 y.o. female.  The history is provided by the patient.  Chest Pain Pain location:  L chest Pain quality: dull and radiating   Pain radiates to:  Upper back Pain severity:  Moderate Onset quality:  Gradual Duration:  1 day Timing:  Intermittent Progression:  Improving Chronicity:  New Context: at rest   Context: not breathing and not movement   Relieved by:  Nothing Worsened by:  Nothing Ineffective treatments:  None tried Associated symptoms: diaphoresis and heartburn   Associated symptoms: no abdominal pain, no altered mental status, no fever, no headache, no lower extremity edema, no numbness, no palpitations, no shortness of breath and no vomiting   Risk factors: hypertension   Risk factors: not female    65 year old female with a history of reflux presenting to the emergency department with left-sided chest pain that started last night.  She states that she noticed some dull discomfort in the left side of her chest which subsequently improved.  She noticed it again this morning while at work and also additionally endorsed some clamminess.  She went to her PCP where she had some EKG changes and was advised to present to the emergency department.  On arrival, the patient states that her chest pain has eased off some.  She still describes it as a dull ache with radiation to her left shoulder and back.  No shortness of breath.  Her diaphoresis has resolved.  No interventions in route to the ED.  Past Medical History:  Diagnosis Date   Colon polyps     Patient Active Problem List   Diagnosis Date Noted   Vaginal atrophy 11/07/2012    Past Surgical History:  Procedure Laterality Date   ABDOMINAL HYSTERECTOMY     CESAREAN SECTION     chloecystectomy     cyst removed from  neck     OOPHORECTOMY     right toe surgery     TONSILLECTOMY       OB History     Gravida  2   Para  1   Term      Preterm      AB  1   Living  0      SAB  1   IAB      Ectopic      Multiple      Live Births              Family History  Problem Relation Age of Onset   Cancer Son        ueying sarcoma   Other Mother        pacemaker   Heart attack Sister 55       and second one in the her 22's   Other Sister        pacemaker    Social History   Tobacco Use   Smoking status: Never   Smokeless tobacco: Never  Substance Use Topics   Alcohol use: Yes    Comment: once a year   Drug use: No    Home Medications Prior to Admission medications   Medication Sig Start Date End Date Taking? Authorizing Provider  ezetimibe (ZETIA) 10 MG tablet Take 10 mg by mouth daily.   Yes [provider]  pantoprazole (PROTONIX) 20 MG tablet Take 20 mg by mouth daily.   Yes [provider]  Vitamin D, Ergocalciferol, (DRISDOL) 1.25 MG (50000 UNIT) CAPS capsule Take 50,000 Units by mouth 2 (two) times a week. 08/14/19  Yes [provider]    Allergies    Benadryl [diphenhydramine hcl]  Review of Systems   Review of Systems  Constitutional:  Positive for diaphoresis. Negative for fever.  Respiratory:  Negative for shortness of breath.   Cardiovascular:  Positive for chest pain. Negative for palpitations.  Gastrointestinal:  Positive for heartburn. Negative for abdominal pain and vomiting.  Neurological:  Negative for numbness and headaches.  All other systems reviewed and are negative.  Physical Exam Updated Vital Signs BP (!) 164/102   Pulse 79   Temp 98.5 F (36.9 C) (Oral)   Resp (!) 22   Ht 5\' 5"  (1.651 m)   Wt 90.3 kg   SpO2 98%   BMI 33.12 kg/m   Physical Exam Vitals and nursing note reviewed.  Constitutional:      General: She is not in acute distress.    Appearance: She is well-developed. She is not ill-appearing  or diaphoretic.  HENT:     Head: Normocephalic and atraumatic.  Eyes:     Conjunctiva/sclera: Conjunctivae normal.  Cardiovascular:     Rate and Rhythm: Normal rate and regular rhythm.     Heart sounds: Normal heart sounds. No murmur heard. Pulmonary:     Effort: Pulmonary effort is normal. No respiratory distress.     Breath sounds: Normal breath sounds.  Chest:     Chest wall: No tenderness.  Abdominal:     Palpations: Abdomen is soft.     Tenderness: There is no abdominal tenderness.  Musculoskeletal:     Cervical back: Neck supple.     Right lower leg: No edema.     Left lower leg: No edema.  Skin:    General: Skin is warm and dry.     Capillary Refill: Capillary refill takes less than 2 seconds.  Neurological:     General: No focal deficit present.     Mental Status: She is alert and oriented to person, place, and time.    ED Results / Procedures / Treatments   Labs (all labs ordered are listed, but only abnormal results are displayed) Labs Reviewed  BASIC METABOLIC PANEL - Abnormal; Notable for the following components:      Result Value   Glucose, Bld 100 (*)    All other components within normal limits  BRAIN NATRIURETIC PEPTIDE - Abnormal; Notable for the following components:   B Natriuretic Peptide 135.6 (*)    All other components within normal limits  CBC  TROPONIN I (HIGH SENSITIVITY)  TROPONIN I (HIGH SENSITIVITY)    EKG EKG Interpretation  Date/Time:  Thursday January 09 2021 11:35:32 EDT Ventricular Rate:  65 PR Interval:  182 QRS Duration: 84 QT Interval:  430 QTC Calculation: 447 R Axis:   -20 Text Interpretation: Normal sinus rhythm Low voltage QRS No STEMI Confirmed by [id number] ([id number]) on 01/09/2021 11:49:08 AM  Radiology DG Chest 2 View  Result Date: 01/09/2021 CLINICAL DATA:  Chest pain radiating to LEFT shoulder blade EXAM: CHEST - 2 VIEW COMPARISON:  08/07/2014 FINDINGS: Normal heart size, mediastinal contours, and pulmonary  vascularity. Lungs clear. No pleural effusion or pneumothorax. Bones unremarkable. IMPRESSION: Normal exam. Electronically Signed   By: 08/09/2014 M.D.   On: 01/09/2021 12:34  Procedures Procedures   Medications Ordered in ED Medications  nitroGLYCERIN (NITROSTAT) SL tablet 0.4 mg (0.4 mg Sublingual Given 01/09/21 1538)  alum & mag hydroxide-simeth (MAALOX/MYLANTA) 200-200-20 MG/5ML suspension 30 mL (30 mLs Oral Given 01/09/21 1600)    And  lidocaine (XYLOCAINE) 2 % viscous mouth solution 15 mL (15 mLs Oral Given 01/09/21 1600)  acetaminophen (TYLENOL) tablet 1,000 mg (1,000 mg Oral Given 01/09/21 1600)    ED Course  I have reviewed the triage vital signs and the nursing notes.  Pertinent labs & imaging results that were available during my care of the patient were reviewed by me and considered in my medical decision making (see chart for details).    MDM Rules/Calculators/A&P HEAR Score: 5                         Docia A Gallicchio is a 65 y.o. female who presented to the Emergency Department c/o chest pain. Past medical records have been reviewed and are notable for negative hx of HTN and CAD.   Pertinent exam findings include:well appearing woman in NAD, lungs CTAB, no chest wall tenderness, no LE edema, no abdominal tenderness.  EKG: Normal sinus rhythm with a rate of 65 and no evidence of acute ischemic changes, abnormal intervals, or dysrhythmia. No concerning change from prior   Lab results include: Troponins x2 negative, CBC without a leukocytosis, anemia or platelet abnormality, BMP unremarkable, BNP nonspecifically elevated to 136.  Imaging results include: Chest x-ray without acute cardiac or pulmonary abnormality.  Course of tx has consisted of: Aspirin 325 mg, nitroglycerin 0.4 mg sublingual.  1 g Tylenol.  Maalox and viscous lidocaine.  Thought process: Patient presenting to the emergency department with dull left-sided chest pain with radiation to her left  shoulder.  Pain has since resolved.  She is a heart score of 5.  She has never had an ischemic evaluation.  She presented to the emergency department hypertensive with some discomfort. Differential diagnosis includes: ACS, pneumonia, pneumothorax, hypertensive emergency/urgency, Pericarditis/myocarditis, GERD, PUD, musculoskeletal. HEART score of 5, moderate risk. Patient given ASA 325 mg,  given nitroglycerin.  Unlikely PE. Labs unremarkable.  Unlikely pneumonia, no cough, no leukocytosis, no fevers, CXR and exam without acute findings. Unlikely pneumothorax, no findings on  CXR. Unlikely pericarditis/myocarditis, does not fit clinical picture. Chest pain not clearly exertional. Unlikely dissection, no pulse deficit, no tearing chest pain, no neurologic complaints. Due to patient's high risk will rule out ACS.  Troponins x2 ultimately resulted negative.  The patient's chest pain improved with some blood pressure control.  She has no vision changes.  She has no evidence of endorgan damage.  No evidence for hypertensive emergency at this time.  Given resolution of symptoms, low concern for unstable angina at this time.    Patient's clinical presentation is most consistent with atypical chest pain and hypertension.  Given the patient's improvement, the the patient was offered discharge with plan for close follow-up with cardiology for cardiac stress testing.  The patient was advised to call to schedule follow-up.  Final Clinical Impression(s) / ED Diagnoses Final diagnoses:  Chest pain, unspecified type    Rx / DC Orders ED Discharge Orders     None        Ernie Avena, MD 01/09/21 2123

## 2021-01-14 DIAGNOSIS — R072 Precordial pain: Secondary | ICD-10-CM | POA: Insufficient documentation

## 2021-01-14 NOTE — Progress Notes (Signed)
Patient referred by Kathyrn Lass, MD for chest pain  Subjective:   Christina Caldwell, female    DOB: [birth year], 65 y.o.   MRN: [id number]   Chief Complaint  Patient presents with   Chest Pain   Dizziness   Hospitalization Follow-up   New Patient (Initial Visit)     HPI  65 year old Caucasian female with chest pain  Patient works at [employer], currently doing waitressing work.  Few days ago, she had an episode of chest pain that woke her from sleep at 3 AM and persisted for several hours.  This was associated with severe headache.  Her blood pressure was noted to be elevated up to 170/80 mmHg.  She initially saw her PCP regularly and was then sent to emergency room.  EKG showed unchanged inferior T wave inversions.  ACS was ruled out.  BNP was mildly elevated at 155.  Patient reports having eaten spaghetti and chocolate chip ice cream the night before.  She was started on contact resolved with some improvement in symptoms.  She has not had any recurrent chest pain since then.  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  Patient is originally from [place] area, has been in [place] since 2000.  She has 1 adopted son, who is alive.  Patient lost her first son to cancer several years ago   Past Medical History:  Diagnosis Date   Colon polyps      Past Surgical History:  Procedure Laterality Date   ABDOMINAL HYSTERECTOMY     CESAREAN SECTION     chloecystectomy     cyst removed from neck     OOPHORECTOMY     right toe surgery     TONSILLECTOMY       Social History   Tobacco Use  Smoking Status Never  Smokeless Tobacco Never    Social History   Substance and Sexual Activity  Alcohol Use Yes   Comment: once a year     Family History  Problem Relation Age of Onset   Cancer Son        ueying sarcoma   Other Mother         pacemaker   Heart attack Sister 58       and second one in the her 60's   Other Sister        pacemaker     Current Outpatient Medications on File Prior to Visit  Medication Sig Dispense Refill   ezetimibe (ZETIA) 10 MG tablet Take 10 mg by mouth daily.     pantoprazole (PROTONIX) 20 MG tablet Take 20 mg by mouth daily.     Vitamin D, Ergocalciferol, (DRISDOL) 1.25 MG (50000 UNIT) CAPS capsule Take 50,000 Units by mouth 2 (two) times a week.     No current facility-administered medications on file prior to visit.    Cardiovascular and other pertinent studies:  EKG 01/15/2021: Sinus rhythm 62 bpm  Low voltage in precordial leads.  Inferior T wav einversion, consider ischemia  11/23/2019: CT scan Calcified atherosclerotic disease is evident within the coronary arterial distribution. The patient's total coronary artery calcium score is 52, which places the patient in the 74th percentile for individuals of matched age, gender and race/ethnicity.  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 Surveillance imaging should be considered.  Recent labs: 01/09/2021: Glucose 100, BUN/Cr 19/0.83. EGFR >60.  Na/K 139/3.8. Rest of the CMP normal H/H 13/39. MCV 91. Platelets 273 BNP 135 Chol 173, TG 72, HDL 66, LDL 93 Trop HS 4,3   Review of Systems  Cardiovascular:  Positive for chest pain and leg swelling. Negative for dyspnea on exertion, palpitations and syncope.        Vitals:   01/15/21 0836  BP: (!) 145/83  Pulse: 73  Resp: 16  Temp: 98 F (36.7 C)  SpO2: 97%     Body mass index is 32.65 kg/m. Filed Weights   01/15/21 0836  Weight: 196 lb 3.2 oz (89 kg)     Objective:   Physical Exam Vitals and nursing note reviewed.  Constitutional:      General: She is not in acute distress. Neck:     Vascular: No JVD.  Cardiovascular:     Rate and Rhythm: Normal rate and regular rhythm.     Heart sounds: Normal heart sounds. No murmur heard. Pulmonary:     Effort:  Pulmonary effort is normal.     Breath sounds: Normal breath sounds. No wheezing or rales.  Musculoskeletal:     Right lower leg: No edema.     Left lower leg: No edema.        Assessment & Recommendations:    65 year old Caucasian female with chest pain  Chest pain: Atypical, most likely GERD.  However, patient has risk factors including elevated coronary calcium score, mild hyperlipidemia, and hypertension.  Recommend exercise nuclear stress test, echocardiogram.  Started patient on metoprolol succinate 25 mg daily.  Also added Crestor 5 mg daily.  Also, given her sudden spike in blood pressure, ordered renal artery duplex ultrasound.  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  We will reevaluate on echocardiogram.  Further recommendations after above testing.  Thank you for referring the patient to [place]. Please feel free to contact with any questions.   Nigel Mormon, MD Pager: [phone] Office: [phone]

## 2021-01-15 ENCOUNTER — Encounter: Payer: Self-pay | Admitting: Cardiology

## 2021-01-15 ENCOUNTER — Ambulatory Visit: Payer: PRIVATE HEALTH INSURANCE | Admitting: Cardiology

## 2021-01-15 ENCOUNTER — Other Ambulatory Visit: Payer: Self-pay

## 2021-01-15 VITALS — BP 145/83 | HR 73 | Temp 98.0°F | Resp 16 | Ht 65.0 in | Wt 196.2 lb

## 2021-01-15 DIAGNOSIS — E782 Mixed hyperlipidemia: Secondary | ICD-10-CM | POA: Insufficient documentation

## 2021-01-15 DIAGNOSIS — I7121 Aneurysm of the ascending aorta, without rupture: Secondary | ICD-10-CM | POA: Insufficient documentation

## 2021-01-15 DIAGNOSIS — I10 Essential (primary) hypertension: Secondary | ICD-10-CM

## 2021-01-15 DIAGNOSIS — R931 Abnormal findings on diagnostic imaging of heart and coronary circulation: Secondary | ICD-10-CM

## 2021-01-15 DIAGNOSIS — I77810 Thoracic aortic ectasia: Secondary | ICD-10-CM

## 2021-01-15 DIAGNOSIS — R072 Precordial pain: Secondary | ICD-10-CM

## 2021-01-15 MED ORDER — METOPROLOL SUCCINATE ER 25 MG PO TB24: 25.0000 mg | ORAL_TABLET | Freq: Every day | ORAL | 1 refills | Status: DC

## 2021-01-15 MED ORDER — NITROGLYCERIN 0.4 MG SL SUBL
0.4000 mg | SUBLINGUAL_TABLET | SUBLINGUAL | 1 refills | Status: DC | PRN
Start: 2021-01-15 — End: 2022-09-18

## 2021-01-15 MED ORDER — ROSUVASTATIN CALCIUM 5 MG PO TABS: 5.0000 mg | ORAL_TABLET | Freq: Every day | ORAL | 1 refills | Status: DC

## 2021-01-15 NOTE — Patient Instructions (Signed)
Please hold metoprolol on the day before and the day of your stress test

## 2021-02-05 ENCOUNTER — Ambulatory Visit: Payer: PRIVATE HEALTH INSURANCE

## 2021-02-05 ENCOUNTER — Other Ambulatory Visit: Payer: Self-pay

## 2021-02-05 DIAGNOSIS — R072 Precordial pain: Secondary | ICD-10-CM

## 2021-02-05 DIAGNOSIS — I10 Essential (primary) hypertension: Secondary | ICD-10-CM

## 2021-02-09 ENCOUNTER — Other Ambulatory Visit: Payer: Self-pay | Admitting: Cardiology

## 2021-02-25 NOTE — Progress Notes (Signed)
Patient referred by Kathyrn Lass, MD for chest pain  Subjective:   Christina Caldwell, female    DOB: 1955-10-04, 65 y.o.   MRN: [id number]  Chief Complaint  Patient presents with   Precordial pain   Follow-up   Results     HPI  64 year old Caucasian female with chest pain  Patient has not had any significant recurrent chest pain.  She is going to undergo EGD and colonoscopy with GI given that there was initial suspicion for acid reflux as cause of her chest pain.  Blood pressures well controlled today.  Patient did not tolerate statins due to severe myalgias.  She was to continue Zetia alone.  Reviewed recent test results with patient, details below.   Initial consultation HPI 12/2020: Patient works at [employer], currently doing waitressing work.  Few days ago, she had an episode of chest pain that woke her from sleep at 3 AM and persisted for several hours.  This was associated with severe headache.  Her blood pressure was noted to be elevated up to 170/80 mmHg.  She initially saw her PCP regularly and was then sent to emergency room.  EKG showed unchanged inferior T wave inversions.  ACS was ruled out.  BNP was mildly elevated at 155.  Patient reports having eaten spaghetti and chocolate chip ice cream the night before.  She was started on contact resolved with some improvement in symptoms.  She has not had any recurrent chest pain since then.  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  Patient is originally from [place] area, has been in [place] since 2000.  She has 1 adopted son, who is alive.  Patient lost her first son to cancer several years ago   Current Outpatient Medications on File Prior to Visit  Medication Sig Dispense Refill   ezetimibe (ZETIA) 10 MG tablet Take 10 mg by mouth daily.     metoprolol succinate (TOPROL-XL) 25 MG 24 hr  tablet Take 1 tablet (25 mg total) by mouth daily. Take with or immediately following a meal. 30 tablet 1   nitroGLYCERIN (NITROSTAT) 0.4 MG SL tablet Place 1 tablet (0.4 mg total) under the tongue every 5 (five) minutes as needed for chest pain. 30 tablet 1   pantoprazole (PROTONIX) 40 MG tablet Take 40 mg by mouth daily.     rosuvastatin (CRESTOR) 5 MG tablet Take 1 tablet (5 mg total) by mouth daily. 30 tablet 1   Vitamin D, Ergocalciferol, (DRISDOL) 1.25 MG (50000 UNIT) CAPS capsule Take 50,000 Units by mouth 2 (two) times a week.     No current facility-administered medications on file prior to visit.    Cardiovascular and other pertinent studies:  Exercise Sestamibi stress test 02/05/2021: Exercise nuclear stress test was performed using Bruce protocol. Patient reached 4.6 METS, and 94% of age predicted maximum heart rate. Exercise capacity was low. No chest pain reported. Heart rate and hemodynamic response were normal.  Peak EKG demonstrated sinus tachycardia, occasional PVC, low voltage, no significant ST-T changes. Normal myocardial perfusion. Stress LVEF 74%. Intermediate risk study due to low exercise capacity. No ischemia/infarction noted.   Echocardiogram 02/05/2021:  Left ventricle cavity is normal in size. Mild concentric hypertrophy of  the left ventricle. Normal global wall motion. Normal LV systolic function  with EF 65%. Doppler evidence of grade I (impaired) diastolic dysfunction,  normal LAP.  Left atrial cavity is moderately dilated.  Trileaflet aortic valve.  Trace aortic regurgitation.  Mild bileaflet prolapse of the mitral valve leaflets. Moderate (Grade II)  mitral regurgitation.  Mild tricuspid regurgitation.  No evidence of pulmonary hypertension.  Renal artery duplex 02/05/2021: No evidence of renal artery occlusive disease in either renal artery. Mild increase in resistive index in the right kidney.   Renal length is within normal limits for both  kidneys. Normal abdominal aorta and flow velocities noted.  EKG 01/15/2021: Sinus rhythm 62 bpm  Low voltage in precordial leads.  Inferior T wav einversion, consider ischemia  11/23/2019: CT scan Calcified atherosclerotic disease is evident within the coronary arterial distribution. The patient's total coronary artery calcium score is 52, which places the patient in the 74th percentile for individuals of matched age, gender and race/ethnicity.  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 Surveillance imaging should be considered.  Recent labs: 01/09/2021: Glucose 100, BUN/Cr 19/0.83. EGFR >60. Na/K 139/3.8. Rest of the CMP normal H/H 13/39. MCV 91. Platelets 273 BNP 135 Chol 173, TG 72, HDL 66, LDL 93 Trop HS 4,3   Review of Systems  Cardiovascular:  Positive for chest pain and leg swelling. Negative for dyspnea on exertion, palpitations and syncope.        Vitals:   02/26/21 0839  BP: 130/82  Resp: 16  Temp: 97.8 F (36.6 C)  SpO2: 98%     Body mass index is 34.28 kg/m. Filed Weights   02/26/21 0839  Weight: 206 lb (93.4 kg)     Objective:   Physical Exam Vitals and nursing note reviewed.  Constitutional:      General: She is not in acute distress. Neck:     Vascular: No JVD.  Cardiovascular:     Rate and Rhythm: Normal rate and regular rhythm.     Heart sounds: Normal heart sounds. No murmur heard. Pulmonary:     Effort: Pulmonary effort is normal.     Breath sounds: Normal breath sounds. No wheezing or rales.  Musculoskeletal:     Right lower leg: No edema.     Left lower leg: No edema.        Assessment & Recommendations:    65 year old Caucasian female with chest pain  Chest pain: Atypical, most likely GERD.  Low exercise capacity, but no significant ischemia/infarction on stress testing.   She does have elevated calcium score of 52. Not on aspirin due to concern for severe upper GI symptoms.   Statin intolerant due to myalgias.  She  does not want to try other statins at this time. Continue Zetia 10 mg daily.  LDL 93, HDL 66.   Continue Mediterranean style heart healthy diet and regular exercise. Repeat lipid panel in 6 months.  If LDL remains above 70, could consider PCSK9 elevated at that time.   Given mild ascending aorta dilatation, could continue metoprolol titrate 25 mg daily.  Exertional dyspnea: Improved.  Moderate mitral regurgitation, unlikely to be responsible for dyspnea. Monitor with repeat echocardiogram in 1 year.  Dilated ascending aorta: As above  Repeat echocardiogram in 1 year     Nigel Mormon, MD Pager: [phone] Office: [phone]

## 2021-02-26 ENCOUNTER — Encounter: Payer: Self-pay | Admitting: Cardiology

## 2021-02-26 ENCOUNTER — Other Ambulatory Visit: Payer: Self-pay

## 2021-02-26 ENCOUNTER — Ambulatory Visit: Payer: PRIVATE HEALTH INSURANCE | Admitting: Cardiology

## 2021-02-26 VITALS — BP 130/82 | Temp 97.8°F | Resp 16 | Ht 65.0 in | Wt 206.0 lb

## 2021-02-26 DIAGNOSIS — I340 Nonrheumatic mitral (valve) insufficiency: Secondary | ICD-10-CM

## 2021-02-26 DIAGNOSIS — I77810 Thoracic aortic ectasia: Secondary | ICD-10-CM

## 2021-02-26 DIAGNOSIS — E782 Mixed hyperlipidemia: Secondary | ICD-10-CM

## 2021-03-09 ENCOUNTER — Other Ambulatory Visit: Payer: Self-pay | Admitting: Cardiology

## 2021-03-09 DIAGNOSIS — E782 Mixed hyperlipidemia: Secondary | ICD-10-CM

## 2021-08-23 LAB — LIPID PANEL
Chol/HDL Ratio: 2.9 ratio (ref 0.0–4.4)
Cholesterol, Total: 197 mg/dL (ref 100–199)
HDL: 67 mg/dL (ref >39)
LDL Chol Calc (NIH): 112 mg/dL — ABNORMAL HIGH (ref 0–99)
Triglycerides: 103 mg/dL (ref 0–149)
VLDL Cholesterol Cal: 18 mg/dL (ref 5–40)

## 2021-08-29 ENCOUNTER — Encounter: Payer: Self-pay | Admitting: Cardiology

## 2021-08-29 ENCOUNTER — Ambulatory Visit: Payer: PRIVATE HEALTH INSURANCE | Admitting: Cardiology

## 2021-08-29 VITALS — BP 124/74 | HR 69 | Temp 97.8°F | Resp 16 | Ht 63.0 in | Wt 205.0 lb

## 2021-08-29 DIAGNOSIS — E782 Mixed hyperlipidemia: Secondary | ICD-10-CM

## 2021-08-29 DIAGNOSIS — R931 Abnormal findings on diagnostic imaging of heart and coronary circulation: Secondary | ICD-10-CM

## 2021-08-29 DIAGNOSIS — I340 Nonrheumatic mitral (valve) insufficiency: Secondary | ICD-10-CM

## 2021-08-29 DIAGNOSIS — I77810 Thoracic aortic ectasia: Secondary | ICD-10-CM

## 2021-08-29 NOTE — Progress Notes (Signed)
? ? ?Patient referred by Kathyrn Lass, MD for chest pain ? ?Subjective:  ? ?Christina Caldwell, female    DOB: 1956/01/31, 66 y.o.   MRN: [id number] ? ?Chief Complaint  ?Patient presents with  ? Ascending aorta dilatation  ? Follow-up  ?  6 month  ? Results  ?  lab  ? ? ?HPI ? ?66 year old Caucasian female with mod MR, ascending aorta dilatation, elevated calcium score ? ?Patient is doing well. She does not have any cardiac complaints today. She underwent recent EGD and colonoscopy, was treated for possible barrett's esophagus and had precancerous polypectomy. Reviewed recent test results with the patient, details below.  ? ? ?Initial consultation HPI 12/2020: ?Patient works at [employer], currently doing waitressing work.  Few days ago, she had an episode of chest pain that woke her from sleep at 3 AM and persisted for several hours.  This was associated with severe headache.  Her blood pressure was noted to be elevated up to 170/80 mmHg.  She initially saw her PCP regularly and was then sent to emergency room.  EKG showed unchanged inferior T wave inversions.  ACS was ruled out.  BNP was mildly elevated at 155.  Patient reports having eaten spaghetti and chocolate chip ice cream the night before.  She was started on contact resolved with some improvement in symptoms.  She has not had any recurrent chest pain since then.  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 ? ?Patient is originally from [place] area, has been in [place] since 2000.  She has 1 adopted son, who is alive.  Patient lost her first son to cancer several years ago ? ? ?Current Outpatient Medications:  ?  acetaminophen (TYLENOL) 325 MG tablet, 1 tablet as needed, Disp: , Rfl:  ?  ezetimibe (ZETIA) 10 MG tablet, Take 10 mg by mouth daily., Disp: , Rfl:  ?  metoprolol succinate (TOPROL-XL) 25 MG 24 hr tablet, TAKE 1  TABLET BY MOUTH DAILY. TAKE WITH OR IMMEDIATELY FOLLOWING A MEAL., Disp: 30 tablet, Rfl: 5 ?  nitroGLYCERIN (NITROSTAT) 0.4 MG SL tablet, Place 1 tablet (0.4 mg total) under the tongue every 5 (five) minutes as needed for chest pain., Disp: 30 tablet, Rfl: 1 ?  omeprazole (PRILOSEC) 20 MG capsule, 1 capsule 30 minutes before morning meal, Disp: , Rfl:  ?  Vitamin D, Ergocalciferol, (DRISDOL) 1.25 MG (50000 UNIT) CAPS capsule, Take 50,000 Units by mouth 2 (two) times a week., Disp: , Rfl:  ? ? ? ?Cardiovascular and other pertinent studies: ? ?EKG 08/29/2021: ?Sinus rhythnm 52 bpm ?Low voltage in precordial leads ? ?Exercise Sestamibi stress test 02/05/2021: ?Exercise nuclear stress test was performed using Bruce protocol. Patient reached 4.6 METS, and 94% of age predicted maximum heart rate. Exercise capacity was low. No chest pain reported. Heart rate and hemodynamic response were normal.  ?Peak EKG demonstrated sinus tachycardia, occasional PVC, low voltage, no significant ST-T changes. Normal myocardial perfusion. Stress LVEF 74%. ?Intermediate risk study due to low exercise capacity. No ischemia/infarction noted.  ? ?Echocardiogram 02/05/2021:  ?Left ventricle cavity is normal in size. Mild concentric hypertrophy of  ?the left ventricle. Normal global wall motion. Normal LV systolic function  ?with EF 65%. Doppler evidence of grade I (impaired) diastolic dysfunction,  ?normal LAP.  ?Left atrial cavity is moderately dilated.  ?Trileaflet aortic valve.  Trace aortic regurgitation.  ?Mild bileaflet  prolapse of the mitral valve leaflets. Moderate (Grade II)  ?mitral regurgitation.  ?Mild tricuspid regurgitation.  ?No evidence of pulmonary hypertension. ? ?Renal artery duplex 02/05/2021: ?No evidence of renal artery occlusive disease in either renal artery. Mild increase in resistive index in the right kidney.   ?Renal length is within normal limits for both kidneys. ?Normal abdominal aorta and flow velocities  noted. ? ?11/23/2019: ?CT scan ?Calcified atherosclerotic disease is evident within the coronary arterial distribution. The patient's total coronary artery calcium score is 52, which places the patient in the 74th percentile for individuals of matched age, gender and race/ethnicity.  ?Ascending aortic ectasia measuring up to 4.1 cm. Surveillance imaging should be considered. ? ?Recent labs: ?08/22/2021; ?Chol 197, TG 103, HDL 67, LDL 112 ? ?01/09/2021: ?Glucose 100, BUN/Cr 19/0.83. EGFR >60. Na/K 139/3.8. Rest of the CMP normal ?H/H 13/39. MCV 91. Platelets 273 ?BNP 135 ?Chol 173, TG 72, HDL 66, LDL 93 ?Trop HS 4,3 ? ? ?Review of Systems  ?Cardiovascular:  Positive for chest pain and leg swelling. Negative for dyspnea on exertion, palpitations and syncope.  ? ?   ? ? ?Vitals:  ? 08/29/21 0827  ?BP: 124/74  ?Pulse: 69  ?Resp: 16  ?Temp: 97.8 ?F (36.6 ?C)  ?SpO2: 98%  ? ? ? ?Body mass index is 36.31 kg/m?. Danley Danker Weights  ? 08/29/21 0827  ?Weight: 205 lb (93 kg)  ? ? ? ?Objective:  ? Physical Exam ?Vitals and nursing note reviewed.  ?Constitutional:   ?   General: She is not in acute distress. ?Neck:  ?   Vascular: No JVD.  ?Cardiovascular:  ?   Rate and Rhythm: Normal rate and regular rhythm.  ?   Heart sounds: Normal heart sounds. No murmur heard. ?Pulmonary:  ?   Effort: Pulmonary effort is normal.  ?   Breath sounds: Normal breath sounds. No wheezing or rales.  ?Musculoskeletal:  ?   Right lower leg: No edema.  ?   Left lower leg: No edema.  ? ? ?   ?  ICD-10-CM   ?1. Ascending aorta dilatation (HCC)  I77.810 EKG 12-Lead  ?  ?2. Nonrheumatic mitral valve regurgitation  I34.0   ?  ?3. Mixed hyperlipidemia  E78.2   ?  ?4. Elevated coronary artery calcium score  R93.1   ?  ? ? ? ?Assessment & Recommendations:  ? ? ?66 year old Caucasian female with mod MR, ascending aorta dilatation, elevated calcium score ? ?Elevated calcium score: ?52 (2021). No iscchemia/infarction on stress testing (01/2021).   ?Not on aspirin due to  concern for severe upper GI symptoms.   ?LDL 112 (08/2021). Statin intolerant due to myalgias.  She does not want to try other statins at this time. ?Continue Zetia 10 mg daily.   ?Continue Mediterranean style heart healthy diet and regular exercise. ?Given mild ascending aorta dilatation, continue metoprolol titrate 25 mg daily. ? ?Dilated ascending aorta: ?4.1 cm. ?Repeat echocardiogram in 01/2022 ? ? ? ?Nigel Mormon, MD ?Pager: [phone] ?Office: [phone] ? ?

## 2021-09-06 ENCOUNTER — Other Ambulatory Visit: Payer: Self-pay | Admitting: Cardiology

## 2021-10-30 ENCOUNTER — Other Ambulatory Visit: Payer: Self-pay | Admitting: Family Medicine

## 2021-10-30 DIAGNOSIS — Z1231 Encounter for screening mammogram for malignant neoplasm of breast: Secondary | ICD-10-CM

## 2021-11-14 ENCOUNTER — Ambulatory Visit
Admission: RE | Admit: 2021-11-14 | Discharge: 2021-11-14 | Disposition: A | Discharge status: Home / Self Care | Payer: PRIVATE HEALTH INSURANCE | Source: Ambulatory Visit | Attending: Family Medicine | Admitting: Family Medicine

## 2021-11-14 DIAGNOSIS — Z1231 Encounter for screening mammogram for malignant neoplasm of breast: Secondary | ICD-10-CM

## 2021-12-01 ENCOUNTER — Other Ambulatory Visit: Payer: Self-pay | Admitting: Family Medicine

## 2021-12-01 DIAGNOSIS — I77819 Aortic ectasia, unspecified site: Secondary | ICD-10-CM

## 2021-12-30 ENCOUNTER — Encounter: Payer: Self-pay | Admitting: Nurse Practitioner

## 2021-12-30 ENCOUNTER — Ambulatory Visit: Payer: PRIVATE HEALTH INSURANCE | Admitting: Nurse Practitioner

## 2021-12-30 VITALS — BP 120/76 | HR 75

## 2021-12-30 DIAGNOSIS — Z789 Other specified health status: Secondary | ICD-10-CM

## 2021-12-30 DIAGNOSIS — Z23 Encounter for immunization: Secondary | ICD-10-CM

## 2021-12-30 NOTE — Progress Notes (Addendum)
Office Visit  Subjective:  Patient ID: Christina Caldwell, female    DOB: 05/27/1955  Age: 66 y.o. MRN: [id number]  CC: wellness exam  HPI Christina Caldwell presents for wellness exam visit for insurance benefit.  Patient has a PCP: Dr. Sigmund Hazel at [facility]. Follows Cardiology. PMH significant for: HTN, HLD, OSA, and  aortic dilation.  Last labs per PCP were completed: April 2023  Health Maintenance:  Colonoscopy: Oct 2022, repeat in 3 years. Mammogram: July 2023 DEXA: reports completed in the last 2-3 years.  Smoker: Never  Immunizations:  Shingrix-  patient declines Pneumovax -declines COVID- x 2.  Tdap: due for this. Would like to get this today.   Lifestyle: Diet- avoids caffeine, avoids citrus and alcohol due to GI upset.  Exercise- no dedicated time for exercise.     Past Medical History:  Diagnosis Date   Colon polyps    Hyperlipidemia     Past Surgical History:  Procedure Laterality Date   ABDOMINAL HYSTERECTOMY     CESAREAN SECTION     chloecystectomy     cyst removed from neck     OOPHORECTOMY     right toe surgery     TONSILLECTOMY      Outpatient Medications Prior to Visit  Medication Sig Dispense Refill   ezetimibe (ZETIA) 10 MG tablet Take 10 mg by mouth daily.     metoprolol succinate (TOPROL-XL) 25 MG 24 hr tablet TAKE 1 TABLET BY MOUTH EVERY DAY WITH OR IMMEDIATELY FOLLOWING A MEAL 30 tablet 5   omeprazole (PRILOSEC) 20 MG capsule 1 capsule 30 minutes before morning meal     acetaminophen (TYLENOL) 325 MG tablet 1 tablet as needed     nitroGLYCERIN (NITROSTAT) 0.4 MG SL tablet Place 1 tablet (0.4 mg total) under the tongue every 5 (five) minutes as needed for chest pain. 30 tablet 1   Vitamin D, Ergocalciferol, (DRISDOL) 1.25 MG (50000 UNIT) CAPS capsule Take 50,000 Units by mouth 2 (two) times a week. (Patient not taking: Reported on 12/30/2021)     No facility-administered medications prior to visit.    ROS Review of Systems  Respiratory:   Negative for shortness of breath.   Cardiovascular:  Negative for chest pain.  Gastrointestinal:  Positive for abdominal pain (at times.). Negative for constipation, diarrhea, nausea and vomiting.  Neurological:  Negative for headaches.    Objective:  BP 120/76   Pulse 75   SpO2 97%   Physical Exam Constitutional:      General: She is not in acute distress. HENT:     Head: Normocephalic.  Cardiovascular:     Rate and Rhythm: Normal rate and regular rhythm.     Heart sounds: Normal heart sounds.  Pulmonary:     Effort: Pulmonary effort is normal.     Breath sounds: Normal breath sounds.  Abdominal:     General: Bowel sounds are normal.     Palpations: Abdomen is soft.  Musculoskeletal:        General: Normal range of motion.  Skin:    General: Skin is warm.  Neurological:     General: No focal deficit present.     Mental Status: She is alert and oriented to person, place, and time.  Psychiatric:        Mood and Affect: Mood normal.        Behavior: Behavior normal.      Assessment & Plan:    Christina Caldwell was seen today for wellness exam.  Diagnoses and all orders for this visit:  Participant in health and wellness plan Adult wellness physical was conducted today. Importance of diet and exercise were discussed in detail.  We reviewed immunizations and gave recommendations regarding current immunization needed for age. Patient declined Shingrix but willing to get TDAP today. Preventative health exams up to date at this time.  Patient was advised yearly wellness exam. Follow-up with PCP as recommended.   Immunization due TDAP given today on left deltoid. Lot: [id number] ,expiration date: 02/04/24 CDC VIS declined.  Tolerated well. No immediate adverse reactions noted. Educated and discussed red flag symptoms and to call 911 or go to emergency room with signs of anaphylaxis.  Take OTC tylenol as needed for expected side effects such as muscle aches or fever.  Patient given the  chance to ask all questions and discussed answers.        No orders of the defined types were placed in this encounter.   No orders of the defined types were placed in this encounter.   Follow-up: as needed.

## 2021-12-31 ENCOUNTER — Ambulatory Visit
Admission: RE | Admit: 2021-12-31 | Discharge: 2021-12-31 | Disposition: A | Discharge status: Home / Self Care | Payer: PRIVATE HEALTH INSURANCE | Source: Ambulatory Visit | Attending: Family Medicine | Admitting: Family Medicine

## 2021-12-31 DIAGNOSIS — I77819 Aortic ectasia, unspecified site: Secondary | ICD-10-CM

## 2021-12-31 MED ORDER — GADOBENATE DIMEGLUMINE 529 MG/ML IV SOLN
20.0000 mL | Freq: Once | INTRAVENOUS | Status: AC | PRN
  Administered 2021-12-31: 20 mL via INTRAVENOUS

## 2022-01-08 ENCOUNTER — Other Ambulatory Visit: Payer: Self-pay | Admitting: Family Medicine

## 2022-01-08 DIAGNOSIS — K769 Liver disease, unspecified: Secondary | ICD-10-CM

## 2022-01-29 ENCOUNTER — Ambulatory Visit
Admission: RE | Admit: 2022-01-29 | Discharge: 2022-01-29 | Disposition: A | Discharge status: Home / Self Care | Payer: PRIVATE HEALTH INSURANCE | Source: Ambulatory Visit | Attending: Family Medicine | Admitting: Family Medicine

## 2022-01-29 DIAGNOSIS — K769 Liver disease, unspecified: Secondary | ICD-10-CM

## 2022-01-29 MED ORDER — GADOBENATE DIMEGLUMINE 529 MG/ML IV SOLN
18.0000 mL | Freq: Once | INTRAVENOUS | Status: AC | PRN
  Administered 2022-01-29: 18 mL via INTRAVENOUS

## 2022-02-26 ENCOUNTER — Ambulatory Visit: Payer: PRIVATE HEALTH INSURANCE

## 2022-02-26 DIAGNOSIS — I77810 Thoracic aortic ectasia: Secondary | ICD-10-CM

## 2022-02-26 DIAGNOSIS — I340 Nonrheumatic mitral (valve) insufficiency: Secondary | ICD-10-CM

## 2022-03-09 ENCOUNTER — Other Ambulatory Visit: Payer: Self-pay | Admitting: Cardiology

## 2022-04-14 IMAGING — DX DG CHEST 2V
2 series · 2 of 2 positions shown · non-contrast
Comparison: 08/07/2014

CLINICAL DATA: Chest pain radiating to LEFT shoulder blade

EXAM:
CHEST - 2 VIEW

[chest pa]
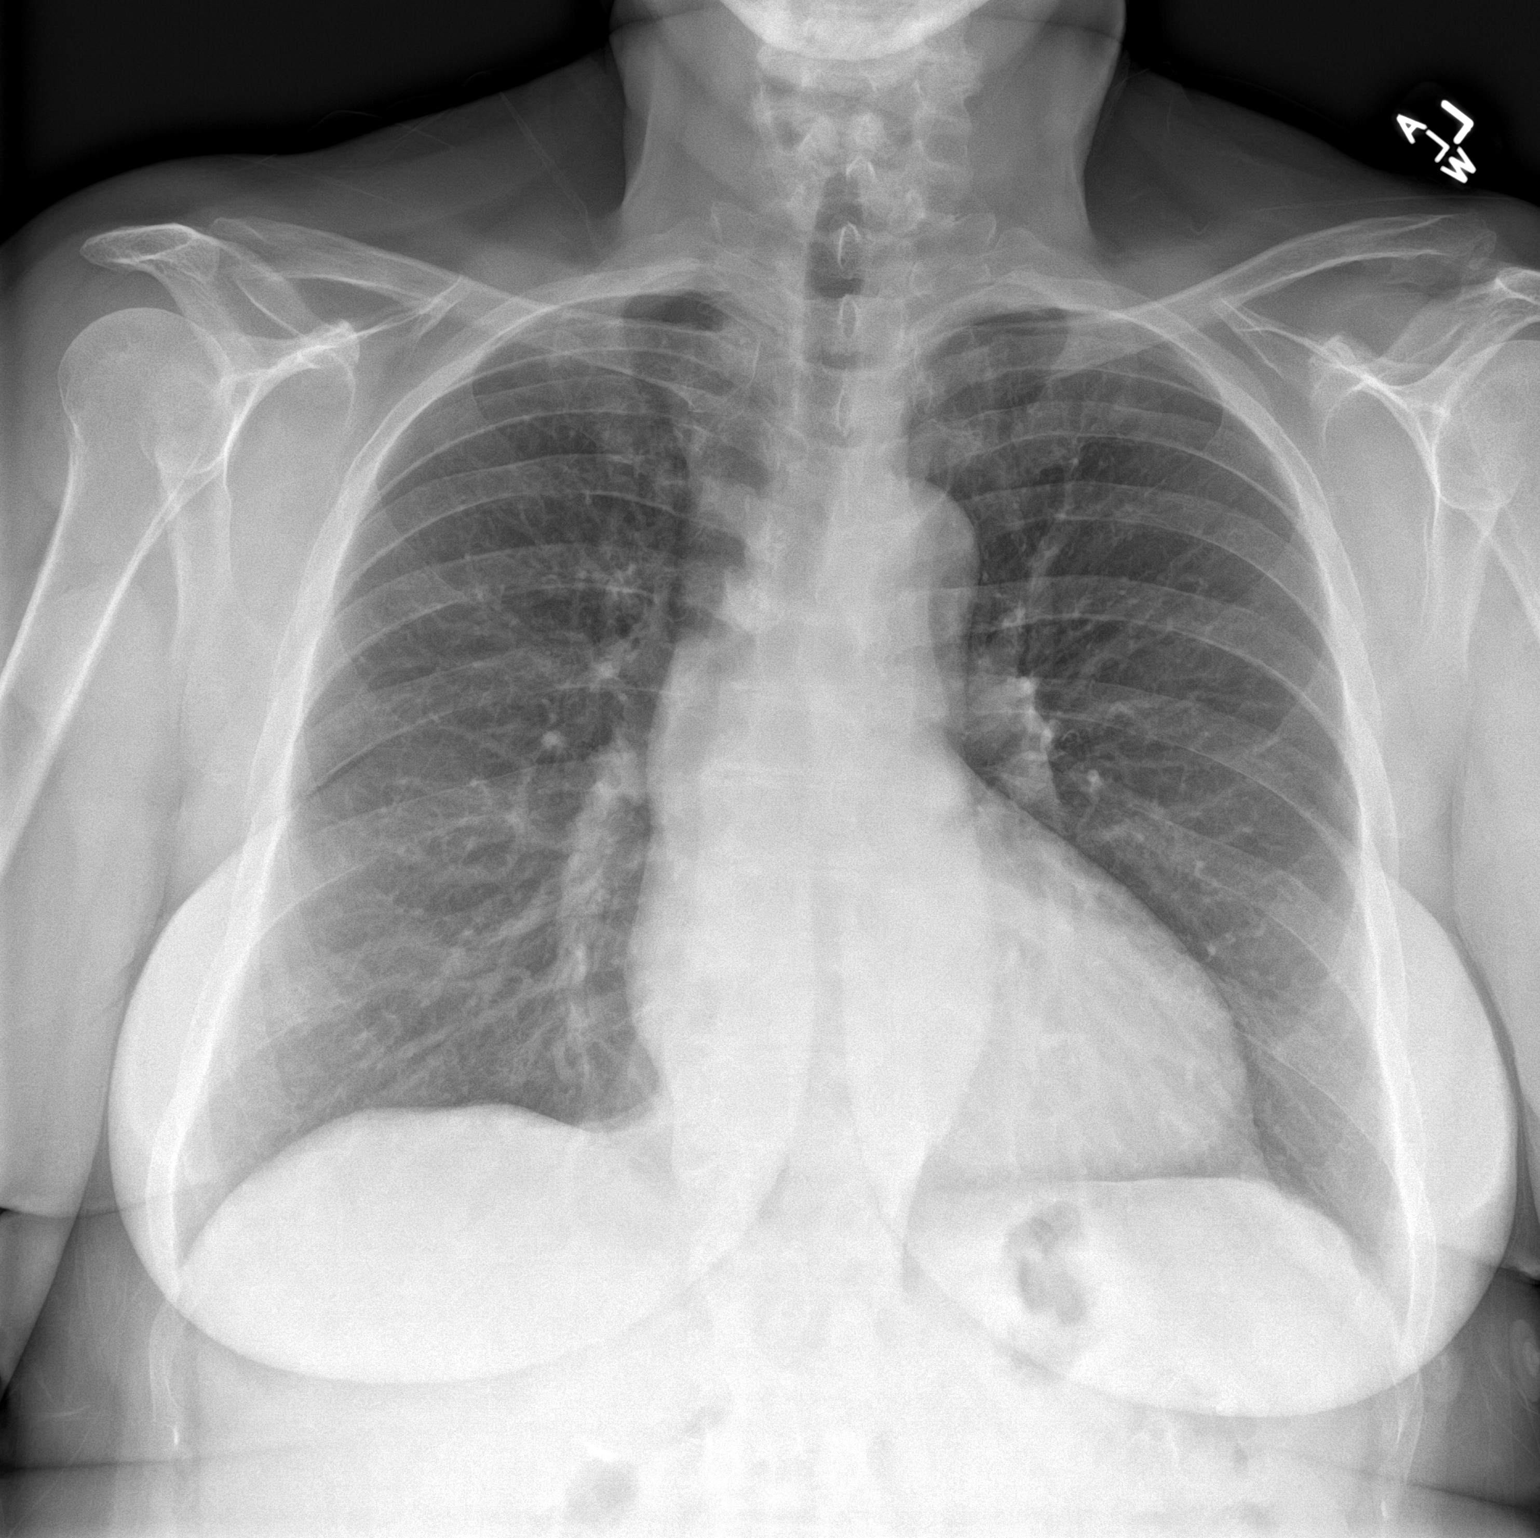

[chest lat]
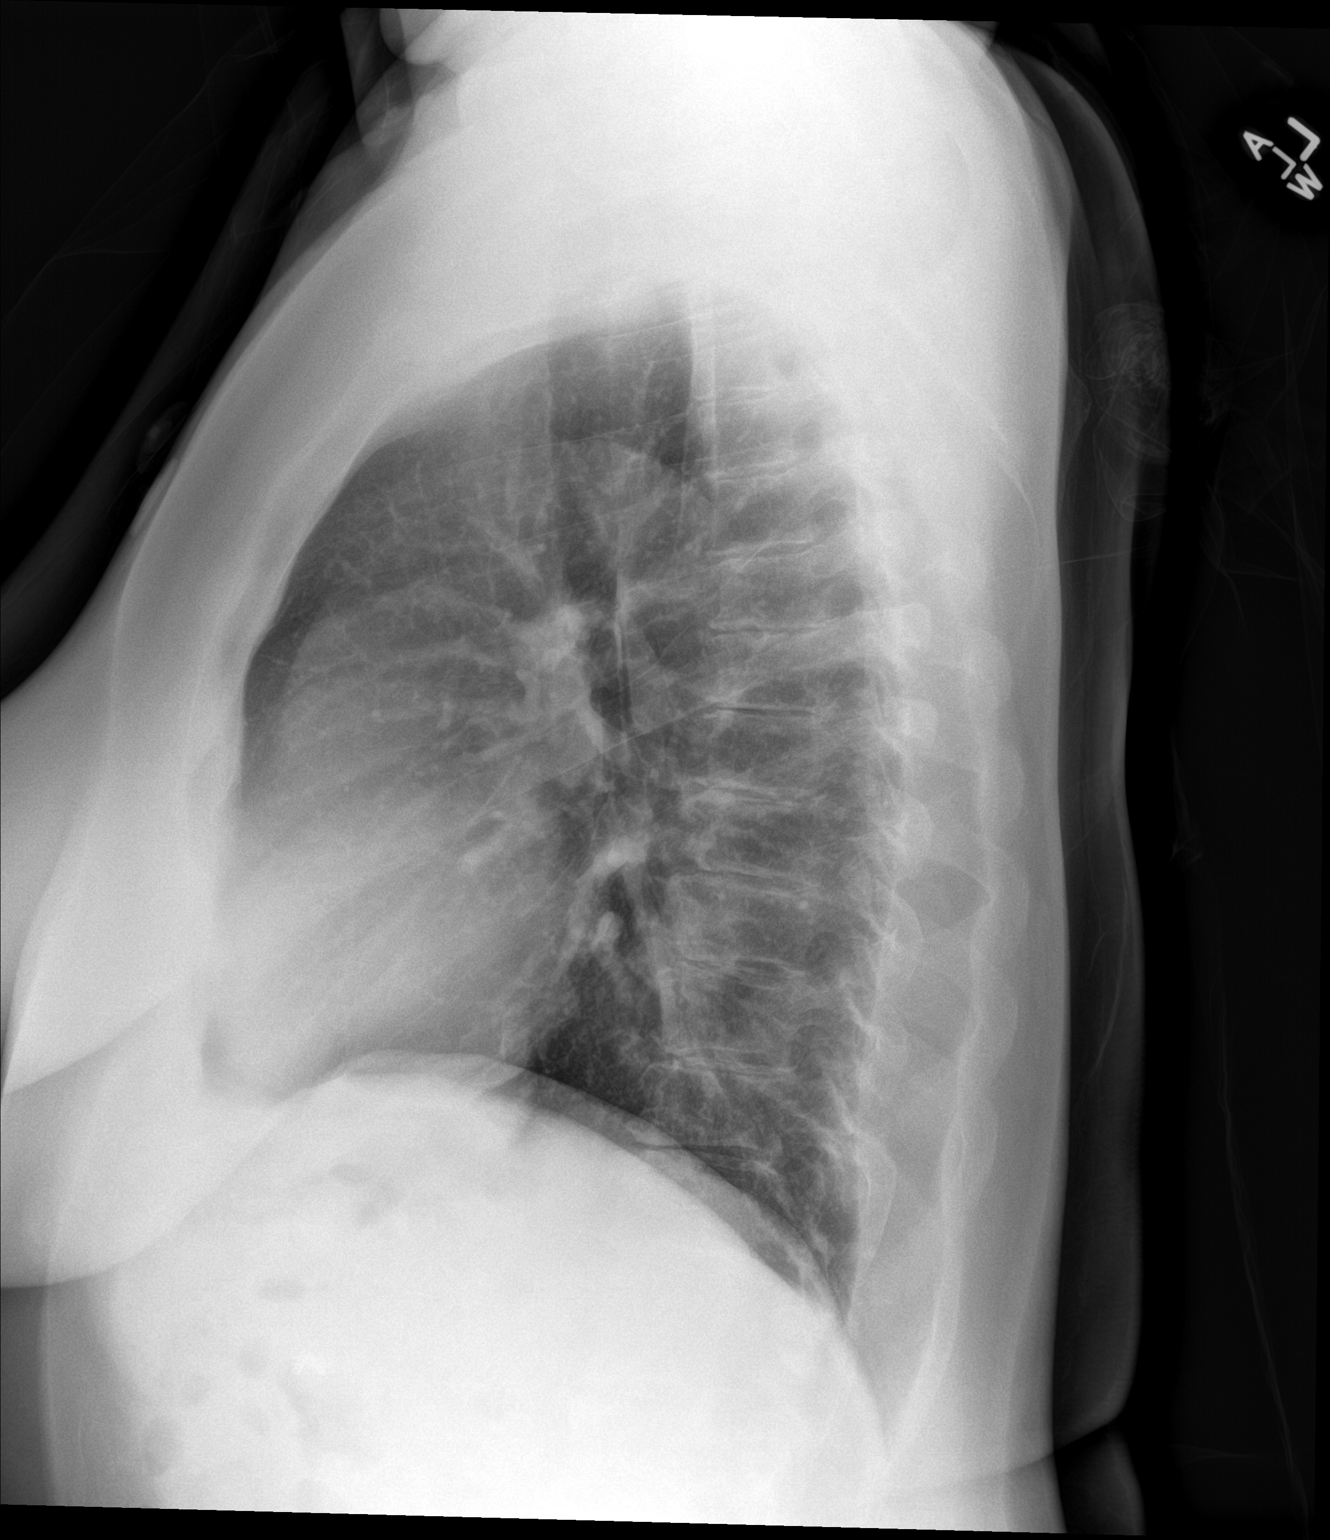

[2 of 2 positions shown; findings below may reference images not displayed]

FINDINGS: Normal heart size, mediastinal contours, and pulmonary vascularity.

Lungs clear.

No pleural effusion or pneumothorax.

Bones unremarkable.
IMPRESSION: Normal exam.

## 2022-09-08 ENCOUNTER — Other Ambulatory Visit: Payer: Self-pay | Admitting: Cardiology

## 2022-09-15 ENCOUNTER — Other Ambulatory Visit: Payer: Self-pay

## 2022-09-15 MED ORDER — METOPROLOL SUCCINATE ER 25 MG PO TB24: ORAL_TABLET | ORAL | 2 refills | Status: DC

## 2022-09-18 ENCOUNTER — Ambulatory Visit: Payer: PRIVATE HEALTH INSURANCE | Admitting: Podiatry

## 2022-09-18 ENCOUNTER — Encounter: Payer: Self-pay | Admitting: Podiatry

## 2022-09-18 ENCOUNTER — Ambulatory Visit (INDEPENDENT_AMBULATORY_CARE_PROVIDER_SITE_OTHER): Payer: PRIVATE HEALTH INSURANCE

## 2022-09-18 DIAGNOSIS — M2021 Hallux rigidus, right foot: Secondary | ICD-10-CM | POA: Diagnosis not present

## 2022-09-18 DIAGNOSIS — L84 Corns and callosities: Secondary | ICD-10-CM

## 2022-09-18 DIAGNOSIS — M2061 Acquired deformities of toe(s), unspecified, right foot: Secondary | ICD-10-CM | POA: Diagnosis not present

## 2022-09-18 DIAGNOSIS — M898X7 Other specified disorders of bone, ankle and foot: Secondary | ICD-10-CM

## 2022-09-18 DIAGNOSIS — M778 Other enthesopathies, not elsewhere classified: Secondary | ICD-10-CM

## 2022-09-18 DIAGNOSIS — M2022 Hallux rigidus, left foot: Secondary | ICD-10-CM

## 2022-09-18 DIAGNOSIS — M2062 Acquired deformities of toe(s), unspecified, left foot: Secondary | ICD-10-CM

## 2022-09-18 DIAGNOSIS — K769 Liver disease, unspecified: Secondary | ICD-10-CM | POA: Insufficient documentation

## 2022-09-18 NOTE — Patient Instructions (Signed)

## 2022-09-18 NOTE — Progress Notes (Unsigned)
Subjective:   Patient ID: Christina Caldwell, female   DOB: 67 y.o.   MRN: [id number]   HPI Chief Complaint  Patient presents with   Toe Pain    1st and 2nd toes bilateral - callused areas on each toe that are rubbing together causing pain, ongoing 3-4 months, tried padding and scraping   New Patient (Initial Visit)   Wearing work shoes and on her feet a lot, at [employer]. No recent treatment. She was wearing heavier socks and that is when it started. She like sher shoes tighter. She has loosened her shoes. No drainage or bleeding.   Review of Systems  All other systems reviewed and are negative.  Past Medical History:  Diagnosis Date   Colon polyps    Hyperlipidemia     Past Surgical History:  Procedure Laterality Date   ABDOMINAL HYSTERECTOMY     CESAREAN SECTION     chloecystectomy     cyst removed from neck     OOPHORECTOMY     right toe surgery     TONSILLECTOMY       Current Outpatient Medications:    acetaminophen (TYLENOL) 325 MG tablet, 1 tablet as needed, Disp: , Rfl:    ezetimibe (ZETIA) 10 MG tablet, Take 10 mg by mouth daily., Disp: , Rfl:    metoprolol succinate (TOPROL-XL) 25 MG 24 hr tablet, TAKE 1 TABLET BY MOUTH EVERY DAY WITH OR IMMEDIATELY FOLLOWING A MEAL, Disp: 30 tablet, Rfl: 2  Allergies  Allergen Reactions   Benadryl [Diphenhydramine Hcl]           Objective:  Physical Exam  ***     Assessment:  ***     Plan:  ***    -corn medial 2nd toe right > left; sub 2 right

## 2022-10-07 ENCOUNTER — Encounter: Payer: Self-pay | Admitting: Cardiology

## 2022-10-07 ENCOUNTER — Ambulatory Visit: Payer: PRIVATE HEALTH INSURANCE | Admitting: Cardiology

## 2022-10-07 VITALS — BP 134/79 | HR 62 | Resp 16 | Ht 63.0 in | Wt 203.0 lb

## 2022-10-07 DIAGNOSIS — I77810 Thoracic aortic ectasia: Secondary | ICD-10-CM

## 2022-10-07 DIAGNOSIS — E782 Mixed hyperlipidemia: Secondary | ICD-10-CM

## 2022-10-07 MED ORDER — METOPROLOL SUCCINATE ER 25 MG PO TB24: ORAL_TABLET | ORAL | 3 refills | Status: DC

## 2022-10-07 NOTE — Progress Notes (Signed)
Patient referred by Sigmund Hazel, MD for chest pain  Subjective:   Christina Caldwell, female    DOB: Sep 25, 1955, 67 y.o.   MRN: [id number]  Chief Complaint  Patient presents with   Ascending aorta dilatation   Follow-up    7 month    HPI  67 year old Caucasian female with mod MR, ascending aorta dilatation, elevated calcium score  Patient is doing well. She does not have any cardiac complaints today. She conitnues to be monitored for barrett's esophagus and had precancerous polyps.   Initial consultation HPI 12/2020: Patient works at [employer], currently doing waitressing work.  Few days ago, she had an episode of chest pain that woke her from sleep at 3 AM and persisted for several hours.  This was associated with severe headache.  Her blood pressure was noted to be elevated up to 170/80 mmHg.  She initially saw her PCP regularly and was then sent to emergency room.  EKG showed unchanged inferior T wave inversions.  ACS was ruled out.  BNP was mildly elevated at 155.  Patient reports having eaten spaghetti and chocolate chip ice cream the night before.  She was started on contact resolved with some improvement in symptoms.  She has not had any recurrent chest pain since then.  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  Patient is originally from [place] area, has been in [place] since 2000.  She has 1 adopted son, who is alive.  Patient lost her first son to cancer several years ago   Current Outpatient Medications:    acetaminophen (TYLENOL) 325 MG tablet, 1 tablet as needed, Disp: , Rfl:    ezetimibe (ZETIA) 10 MG tablet, Take 10 mg by mouth daily., Disp: , Rfl:    metoprolol succinate (TOPROL-XL) 25 MG 24 hr tablet, TAKE 1 TABLET BY MOUTH EVERY DAY WITH OR IMMEDIATELY FOLLOWING A MEAL, Disp: 30 tablet, Rfl: 2    Cardiovascular and other  pertinent studies:  EKG 10/07/2022: Sinus rhythm 60 bpm  Low voltage in precordial leads LAFB T-abnormality -Possible inferior ischemia No significant change compared to previous EKG   Echocardiogram 03/01/2022:  Left ventricle cavity is normal in size and wall thickness. Normal global  wall motion. Normal LV systolic function with EF 58%. Normal diastolic  filling pattern.  Mild mitral valve leaflet thickening. Mild prolapse of the mitral valve  leaflets. Moderate (Grade II) mitral regurgitation.  Mild tricuspid regurgitation.  Mild pulmonic regurgitation.  No evidence of pulmonary hypertension.  No significant change compared to previous study on 02/05/2021.   Exercise Sestamibi stress test 02/05/2021: Exercise nuclear stress test was performed using Bruce protocol. Patient reached 4.6 METS, and 94% of age predicted maximum heart rate. Exercise capacity was low. No chest pain reported. Heart rate and hemodynamic response were normal.  Peak EKG demonstrated sinus tachycardia, occasional PVC, low voltage, no significant ST-T changes. Normal myocardial perfusion. Stress LVEF 74%. Intermediate risk study due to low exercise capacity. No ischemia/infarction noted.   Renal artery duplex 02/05/2021: No evidence of renal artery occlusive disease in either renal artery. Mild increase in resistive index in the right kidney.   Renal length is within normal limits for both kidneys. Normal abdominal aorta and flow velocities noted.  11/23/2019: CT scan Calcified atherosclerotic disease is evident within the coronary arterial distribution. The patient's total coronary artery calcium score is 52, which  places the patient in the 74th percentile for individuals of matched age, gender and race/ethnicity.  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 Surveillance imaging should be considered.  Recent labs: 11/28/2021: Glucose 87, BUN/Cr 16/0.8. EGFR 81. K 4.6. Hb 13.9 Chol 177, TG 108, HDL 65,  LDL 93 TSH 3.0 normal  08/22/2021: Chol 197, TG 103, HDL 67, LDL 112  01/09/2021: Glucose 100, BUN/Cr 19/0.83. EGFR >60. Na/K 139/3.8. Rest of the CMP normal H/H 13/39. MCV 91. Platelets 273 BNP 135 Chol 173, TG 72, HDL 66, LDL 93 Trop HS 4,3   Review of Systems  Cardiovascular:  Negative for chest pain, dyspnea on exertion, leg swelling, palpitations and syncope.         Vitals:   10/07/22 1512  BP: 134/79  Pulse: 62  Resp: 16  SpO2: 94%     Body mass index is 35.96 kg/m. Filed Weights   10/07/22 1512  Weight: 203 lb (92.1 kg)     Objective:   Physical Exam Vitals and nursing note reviewed.  Constitutional:      General: She is not in acute distress. Neck:     Vascular: No JVD.  Cardiovascular:     Rate and Rhythm: Normal rate and regular rhythm.     Heart sounds: Normal heart sounds. No murmur heard. Pulmonary:     Effort: Pulmonary effort is normal.     Breath sounds: Normal breath sounds. No wheezing or rales.  Musculoskeletal:     Right lower leg: No edema.     Left lower leg: No edema.          ICD-10-CM   1. Ascending aorta dilatation (HCC)  I77.810 EKG 12-Lead    Basic metabolic panel    CT ANGIO CHEST AORTA W/CM & OR WO/CM    2. Mixed hyperlipidemia  E78.2        Assessment & Recommendations:    67 year old Caucasian female with mod MR, ascending aorta dilatation, elevated calcium score  Elevated calcium score: 52 (2021). No iscchemia/infarction on stress testing (01/2021).   Not on aspirin due to concern for severe upper GI symptoms.   LDL 112 (08/2021). Statin intolerant due to myalgias.  She does not want to try other statins at this time. Chol 177, TG 108, HDL 65, LDL 93 (11/2021) Continue Zetia 10 mg daily.   Continue Mediterranean style heart healthy diet and regular exercise. Given mild ascending aorta dilatation, continue metoprolol titrate 25 mg daily.  Dilated ascending aorta: 4.1 cm (noted on CT cardiac scoring in  2021). Not appreciated on serial echocardiogram. Check CTA aorta.     Elder Negus, MD Pager: [phone] Office: [phone]

## 2023-06-24 ENCOUNTER — Ambulatory Visit: Payer: PRIVATE HEALTH INSURANCE | Admitting: Nurse Practitioner

## 2023-06-24 ENCOUNTER — Encounter: Payer: Self-pay | Admitting: Nurse Practitioner

## 2023-06-24 VITALS — BP 142/86 | HR 75 | Temp 98.3°F

## 2023-06-24 DIAGNOSIS — J101 Influenza due to other identified influenza virus with other respiratory manifestations: Secondary | ICD-10-CM

## 2023-06-24 LAB — POC COVID19/FLU A&B COMBO
Covid Antigen, POC: NEGATIVE
Influenza A Antigen, POC: POSITIVE — AB
Influenza B Antigen, POC: NEGATIVE

## 2023-06-24 MED ORDER — OSELTAMIVIR PHOSPHATE 75 MG PO CAPS
75.0000 mg | ORAL_CAPSULE | Freq: Two times a day (BID) | ORAL | 0 refills | Status: AC
Start: 2023-06-24 — End: 2023-06-29

## 2023-06-24 NOTE — Progress Notes (Signed)
 Acute Office Visit  Subjective:     Patient ID: Christina Caldwell, female    DOB: 07-Jul-1955, 68 y.o.   MRN: [id number]  Chief Complaint  Patient presents with   Cough    HPI Patient is in today for c/o not feeling well.  Symptoms started Monday afternoon. States she started experiencing cough and bad headache. Denies fevers but has had chills at night. Endorses ear pressure, congestion, and sore throat.  This morning she reports 3 episodes of diarrhea and had one episode of vomiting last night..  She did not get flu shot this year. Has taken Nyquil at night. Lives with her husband, he is not sick at this time.   Review of Systems  Constitutional:  Positive for chills and malaise/fatigue. Negative for fever.  HENT:  Positive for congestion and sore throat. Negative for ear pain, hearing loss and sinus pain.   Respiratory:  Positive for cough and sputum production. Negative for shortness of breath.   Cardiovascular:  Negative for chest pain.  Gastrointestinal:  Positive for vomiting (1 episode). Negative for blood in stool, diarrhea and nausea.  Musculoskeletal:  Negative for myalgias.  Neurological:  Positive for headaches. Negative for dizziness.        Objective:    BP (!) 142/86 (BP Location: Left Arm, Patient Position: Sitting, Cuff Size: Normal)   Pulse 75   Temp 98.3 F (36.8 C)   SpO2 96%    Physical Exam Constitutional:      General: She is not in acute distress. HENT:     Head: Normocephalic.     Right Ear: Tympanic membrane, ear canal and external ear normal.     Left Ear: Tympanic membrane normal. There is impacted cerumen.     Mouth/Throat:     Pharynx: Posterior oropharyngeal erythema present. No oropharyngeal exudate.  Eyes:     Pupils: Pupils are equal, round, and reactive to light.  Cardiovascular:     Rate and Rhythm: Normal rate and regular rhythm.     Heart sounds: Normal heart sounds.  Pulmonary:     Effort: Pulmonary effort is normal.     Breath  sounds: Normal breath sounds.  Abdominal:     General: Bowel sounds are normal.     Palpations: Abdomen is soft.  Musculoskeletal:        General: Normal range of motion.  Lymphadenopathy:     Cervical: No cervical adenopathy.  Neurological:     General: No focal deficit present.     Mental Status: She is alert and oriented to person, place, and time.     Results for orders placed or performed in visit on 06/24/23  POC Covid19/Flu A&B Antigen  Result Value Ref Range   Influenza A Antigen, POC Positive (A) Negative   Influenza B Antigen, POC Negative Negative   Covid Antigen, POC Negative Negative        Assessment & Plan:   Problem List Items Addressed This Visit   None Visit Diagnoses       Influenza A    -  Primary   Relevant Medications   oseltamivir  (TAMIFLU ) 75 MG capsule   Other Relevant Orders   POC Covid19/Flu A&B Antigen (Completed)     Positive for Flu A, she is within treatment window. Treat with Tamiflu  and supportive care measures including rest and fluids.   Meds ordered this encounter  Medications   oseltamivir  (TAMIFLU ) 75 MG capsule    Sig: Take 1 capsule (75 mg  total) by mouth 2 (two) times daily for 5 days.    Dispense:  10 capsule    Refill:  0    Supervising Provider:   BACIGALUPO, ANGELA M [[id number]]    As needed.  Baer Hinton Flores Jasyah Theurer, NP

## 2023-09-27 ENCOUNTER — Other Ambulatory Visit: Payer: Self-pay | Admitting: Family Medicine

## 2023-09-27 ENCOUNTER — Telehealth: Payer: Self-pay | Admitting: Cardiology

## 2023-09-27 DIAGNOSIS — Z1231 Encounter for screening mammogram for malignant neoplasm of breast: Secondary | ICD-10-CM

## 2023-09-27 MED ORDER — METOPROLOL SUCCINATE ER 25 MG PO TB24: ORAL_TABLET | ORAL | 0 refills | Status: DC

## 2023-09-27 NOTE — Telephone Encounter (Signed)
*  STAT* If patient is at the pharmacy, call can be transferred to refill team.   1. Which medications need to be refilled? (please list name of each medication and dose if known) metoprolol  succinate (TOPROL -XL) 25 MG 24 hr tablet    2. Would you like to learn more about the convenience, safety, & potential cost savings by using the [facility]?     3. Are you open to using the [facility] (Type [facility]. ).   4. Which pharmacy/location (including street and city if local pharmacy) is medication to be sent to? [id number] - [place], [place] - [address]    5. Do they need a 30 day or 90 day supply? 90 day

## 2023-09-27 NOTE — Telephone Encounter (Signed)
 [facility]rolol  [facility], asking pt to call and schedule appt.

## 2023-09-28 ENCOUNTER — Ambulatory Visit
Admission: RE | Admit: 2023-09-28 | Discharge: 2023-09-28 | Disposition: A | Discharge status: Home / Self Care | Payer: Self-pay | Source: Ambulatory Visit | Attending: Family Medicine | Admitting: Family Medicine

## 2023-09-28 DIAGNOSIS — Z1231 Encounter for screening mammogram for malignant neoplasm of breast: Secondary | ICD-10-CM

## 2023-10-01 ENCOUNTER — Ambulatory Visit (HOSPITAL_COMMUNITY)
Admission: RE | Admit: 2023-10-01 | Discharge: 2023-10-01 | Disposition: A | Discharge status: Home / Self Care | Payer: Self-pay | Source: Ambulatory Visit | Attending: Cardiology | Admitting: Cardiology

## 2023-10-01 DIAGNOSIS — I77810 Thoracic aortic ectasia: Secondary | ICD-10-CM | POA: Insufficient documentation

## 2023-10-01 MED ORDER — IOHEXOL 350 MG/ML SOLN
80.0000 mL | Freq: Once | INTRAVENOUS | Status: AC | PRN
  Administered 2023-10-01: 80 mL via INTRAVENOUS

## 2023-10-03 ENCOUNTER — Ambulatory Visit: Payer: Self-pay | Admitting: Cardiology

## 2023-10-03 DIAGNOSIS — I77810 Thoracic aortic ectasia: Secondary | ICD-10-CM

## 2023-10-03 NOTE — Progress Notes (Signed)
 Aorta mildly dilated at 4.0 cm, unchanged compared to previous CTA in 2021. Repeat CTA in 2 years (diagnosis: ascending aorta dilatation).  Thanks MJP

## 2023-10-05 NOTE — Telephone Encounter (Signed)
 Patient is returning call. She says she is doing yard work and may not be available when her call is returned, but a voicemail is fine. Primary # on file is preferred.

## 2023-10-07 ENCOUNTER — Ambulatory Visit: Payer: Self-pay | Admitting: Cardiology

## 2023-10-11 ENCOUNTER — Other Ambulatory Visit: Payer: Self-pay | Admitting: Cardiology

## 2023-11-10 ENCOUNTER — Other Ambulatory Visit: Payer: Self-pay | Admitting: Cardiology

## 2023-11-23 ENCOUNTER — Other Ambulatory Visit: Payer: Self-pay | Admitting: Cardiology

## 2023-12-01 DIAGNOSIS — Z86018 Personal history of other benign neoplasm: Secondary | ICD-10-CM | POA: Diagnosis not present

## 2023-12-01 DIAGNOSIS — Z0000 Encounter for general adult medical examination without abnormal findings: Secondary | ICD-10-CM | POA: Diagnosis not present

## 2023-12-01 DIAGNOSIS — M8580 Other specified disorders of bone density and structure, unspecified site: Secondary | ICD-10-CM | POA: Diagnosis not present

## 2023-12-01 DIAGNOSIS — I340 Nonrheumatic mitral (valve) insufficiency: Secondary | ICD-10-CM | POA: Diagnosis not present

## 2023-12-01 DIAGNOSIS — E7800 Pure hypercholesterolemia, unspecified: Secondary | ICD-10-CM | POA: Diagnosis not present

## 2023-12-01 DIAGNOSIS — I2510 Atherosclerotic heart disease of native coronary artery without angina pectoris: Secondary | ICD-10-CM | POA: Diagnosis not present

## 2023-12-01 DIAGNOSIS — Z131 Encounter for screening for diabetes mellitus: Secondary | ICD-10-CM | POA: Diagnosis not present

## 2023-12-03 DIAGNOSIS — K080 Exfoliation of teeth due to systemic causes: Secondary | ICD-10-CM | POA: Diagnosis not present

## 2023-12-08 DIAGNOSIS — K080 Exfoliation of teeth due to systemic causes: Secondary | ICD-10-CM | POA: Diagnosis not present

## 2023-12-09 DIAGNOSIS — K080 Exfoliation of teeth due to systemic causes: Secondary | ICD-10-CM | POA: Diagnosis not present

## 2024-01-06 ENCOUNTER — Telehealth: Payer: Self-pay

## 2024-01-06 NOTE — Telephone Encounter (Signed)
   Pre-operative Risk Assessment    Patient Name: [name]  DOB: June 07, 1955 MRN: [id number]   Date of last office visit: 10/07/22 NEWMAN LAWRENCE, MD Date of next office visit: 02/17/24 [facility], MD   Request for Surgical Clearance    Procedure:  ENDOSCOPY/ COLONOSCOPY  Date of Surgery:  Clearance 02/22/24                                Surgeon:  DR ELSIE CREE Surgeon's Group or Practice Name:  [facility] Phone number:  [phone] Fax number:  [phone]   Type of Clearance Requested:   - Medical    Type of Anesthesia:  Not Indicated   Additional requests/questions:    SignedLucie DELENA Ku   01/06/2024, 1:57 PM

## 2024-01-07 NOTE — Telephone Encounter (Signed)
 Left message to call back to schedule sooner appt per Dr. Elmira.

## 2024-01-07 NOTE — Telephone Encounter (Signed)
   Name: Christina Caldwell  DOB: 05-15-1956  MRN: [id number]  Primary Cardiologist: None  Chart reviewed as part of pre-operative protocol coverage. The patient has an upcoming visit scheduled with Dr. Elmira  on 02/17/2024 at 8 am, at which time clearance can be addressed in case there are any issues that would impact surgical recommendations.  ENDOSCOPY/ COLONOSCOPY is not scheduled until 02/22/2024 as below. I added preop FYI to appointment note so that provider is aware to address at time of outpatient visit.  Per office protocol the cardiology provider should forward their finalized clearance decision and recommendations regarding antiplatelet therapy to the requesting party below.    I will route this message as FYI to requesting party and remove this message from the preop box as separate preop APP input not needed at this time.   Please call with any questions.  Lamarr Satterfield, NP  01/07/2024, 7:28 AM

## 2024-01-07 NOTE — Telephone Encounter (Signed)
 I last saw her in 09/2022.  Most likely, endoscopy/colonoscopy should be a low risk procedure from cardiac standpoint.  However, lets try to get her in sooner if she would need any tests before the procedure scheduled on 02/22/2024.  Thanks MJP

## 2024-01-11 NOTE — Telephone Encounter (Signed)
 Called patient back was able to get a hold of patient and transfer to Dr. Elmira nurse Lyle Rigg, RN

## 2024-01-11 NOTE — Telephone Encounter (Signed)
 Patient is returning call.

## 2024-01-11 NOTE — Telephone Encounter (Signed)
 Patient got a sooner appointment where clearance can be addressed

## 2024-01-11 NOTE — Telephone Encounter (Signed)
 Hey, Can you help me with getting in contact with this patient?

## 2024-01-14 ENCOUNTER — Other Ambulatory Visit (HOSPITAL_COMMUNITY): Payer: Self-pay

## 2024-01-14 ENCOUNTER — Ambulatory Visit: Attending: Cardiology | Admitting: Cardiology

## 2024-01-14 ENCOUNTER — Encounter: Payer: Self-pay | Admitting: Cardiology

## 2024-01-14 VITALS — BP 136/70 | HR 73 | Ht 65.0 in | Wt 211.6 lb

## 2024-01-14 DIAGNOSIS — I340 Nonrheumatic mitral (valve) insufficiency: Secondary | ICD-10-CM | POA: Diagnosis not present

## 2024-01-14 DIAGNOSIS — I7121 Aneurysm of the ascending aorta, without rupture: Secondary | ICD-10-CM

## 2024-01-14 DIAGNOSIS — Z789 Other specified health status: Secondary | ICD-10-CM

## 2024-01-14 DIAGNOSIS — E782 Mixed hyperlipidemia: Secondary | ICD-10-CM | POA: Diagnosis not present

## 2024-01-14 DIAGNOSIS — Z01810 Encounter for preprocedural cardiovascular examination: Secondary | ICD-10-CM

## 2024-01-14 MED ORDER — NEXLIZET 180-10 MG PO TABS
1.0000 | ORAL_TABLET | Freq: Every day | ORAL | 3 refills | Status: AC
  Filled 2024-01-14 – 2024-01-18 (×2): qty 90, 90d supply, fill #0

## 2024-01-14 NOTE — Progress Notes (Signed)
 Cardiology Office Note:  .   Date:  01/14/2024  ID:  Adrien DELENA Serve, DOB 1956-04-12, MRN [id number] PCP: Cleotilde Planas, MD  [facility] Providers Cardiologist:  Newman Lawrence, MD PCP: Cleotilde Planas, MD  Chief Complaint  Patient presents with   Mitral Regurgitation     Christina Caldwell is a 68 y.o. female with mod MR, ascending aorta dilatation, elevated calcium  score  History of Present Illness  Patient is going to undergo EGD/colonoscopy soon.  She denies any chest pain on exertion.  She only has epigastric pain on eating certain foods.  Reviewed recent CT angiogram aorta with the patient.     Vitals:   01/14/24 1142  BP: 136/70  Pulse: 73  SpO2: 98%      Review of Systems  Cardiovascular:  Negative for chest pain, dyspnea on exertion, leg swelling, palpitations and syncope.        Studies Reviewed: SABRA        EKG 01/14/2024: Normal sinus rhythm Possible Left atrial enlargement Left axis deviation Minimal voltage criteria for LVH, may be normal variant ( R in aVL ) When compared with ECG of 09-Jan-2021 11:35, T wave inversion now evident in Anterior leads    CTA aorta 09/2023: Similar mild dilatation of the ascending thoracic aorta. (4.0 cm)  Echocardiogram 02/2022:  Left ventricle cavity is normal in size and wall thickness. Normal global  wall motion. Normal LV systolic function with EF 58%. Normal diastolic  filling pattern.  Mild mitral valve leaflet thickening. Mild prolapse of the mitral valve  leaflets. Moderate (Grade II) mitral regurgitation.  Mild tricuspid regurgitation.  Mild pulmonic regurgitation.  No evidence of pulmonary hypertension.  No significant change compared to previous study on 02/05/2021.   Exercise Sestamibi stress test 01/2021: Exercise nuclear stress test was performed using Bruce protocol. Patient reached 4.6 METS, and 94% of age predicted maximum heart rate. Exercise capacity was low. No chest pain reported. Heart  rate and hemodynamic response were normal.  Peak EKG demonstrated sinus tachycardia, occasional PVC, low voltage, no significant ST-T changes. Normal myocardial perfusion. Stress LVEF 74%. Intermediate risk study due to low exercise capacity. No ischemia/infarction noted.    Renal artery duplex 01/2021: No evidence of renal artery occlusive disease in either renal artery. Mild increase in resistive index in the right kidney.   Renal length is within normal limits for both kidneys. Normal abdominal aorta and flow velocities noted.  Labs 11/2023: Chol 193, TG 193, HDL 55, LDL 105 HbA1C 5.6% Hb 13.9 Cr 0.8   Physical Exam Vitals and nursing note reviewed.  Constitutional:      General: She is not in acute distress. Neck:     Vascular: No JVD.  Cardiovascular:     Rate and Rhythm: Normal rate and regular rhythm.     Heart sounds: Normal heart sounds. No murmur heard. Pulmonary:     Effort: Pulmonary effort is normal.     Breath sounds: Normal breath sounds. No wheezing or rales.      VISIT DIAGNOSES:   ICD-10-CM   1. Preop cardiovascular exam  Z01.810 EKG 12-Lead    2. Mixed hyperlipidemia  E78.2 Bempedoic Acid -Ezetimibe  (NEXLIZET ) 180-10 MG TABS    Lipid panel    Lipid panel    3. Statin intolerance  Z78.9 Bempedoic Acid -Ezetimibe  (NEXLIZET ) 180-10 MG TABS    4. Nonrheumatic mitral valve regurgitation  I34.0 ECHOCARDIOGRAM COMPLETE    5. Aneurysm of ascending aorta without rupture (HCC)  I71.21 ECHOCARDIOGRAM  COMPLETE       Christina Caldwell is a 68 y.o. female with mod MR, ascending aorta dilatation, elevated calcium  score   Assessment & Plan  Elevated calcium  score: 52 (2021). No iscchemia/infarction on stress testing (01/2021).   Not on aspirin  due to concern for severe upper GI symptoms.   LDL 105, goal <70. She has not tolerated statins due to myalgias. Changed Zetia  10 mg daily to Nexlizet  at 180-10 mg daily. Recheck lipid panel in 3 months.   Dilated  ascending aorta: Stable 4 cm ascending aorta dilation.  If corroborated on echocardiogram, I would check echocardiogram once every 2 years.    Mitral regurgitation: Asymptomatic, will check echocardiogram.  Perioperative cardiac risk: Low cardiac risk for EGD/colonoscopy.  Okay to proceed.   Meds ordered this encounter  Medications   Bempedoic Acid -Ezetimibe  (NEXLIZET ) 180-10 MG TABS    Sig: Take 1 tablet by mouth daily.    Dispense:  90 tablet    Refill:  3     F/u in 2 years  Signed, Newman JINNY Lawrence, MD

## 2024-01-14 NOTE — Telephone Encounter (Signed)
 Pt has sooner appt 01/14/24 for preop clearance.

## 2024-01-14 NOTE — Patient Instructions (Signed)
 Medication Instructions:  STOP Zetia   START Nexlizet  180-10 mg daily   *If you need a refill on your cardiac medications before your next appointment, please call your pharmacy*  Lab Work: Lipid panel in 3 months   If you have labs (blood work) drawn today and your tests are completely normal, you will receive your results only by: MyChart Message (if you have MyChart) OR A paper copy in the mail If you have any lab test that is abnormal or we need to change your treatment, we will call you to review the results.  Testing/Procedures: Echocardiogram   Your physician has requested that you have an echocardiogram. Echocardiography is a painless test that uses sound waves to create images of your heart. It provides your doctor with information about the size and shape of your heart and how well your heart's chambers and valves are working. This procedure takes approximately one hour. There are no restrictions for this procedure. Please do NOT wear cologne, perfume, aftershave, or lotions (deodorant is allowed). Please arrive 15 minutes prior to your appointment time.  Please note: We ask at that you not bring children with you during ultrasound (echo/ vascular) testing. Due to room size and safety concerns, children are not allowed in the ultrasound rooms during exams. Our front office staff cannot provide observation of children in our lobby area while testing is being conducted. An adult accompanying a patient to their appointment will only be allowed in the ultrasound room at the discretion of the ultrasound technician under special circumstances. We apologize for any inconvenience.   Follow-Up: At [facility], you and your health needs are our priority.  As part of our continuing mission to provide you with exceptional heart care, our providers are all part of one team.  This team includes your primary Cardiologist (physician) and Advanced Practice Providers or APPs (Physician  Assistants and Nurse Practitioners) who all work together to provide you with the care you need, when you need it.  Your next appointment:   2 year(s)  Provider:   Newman JINNY Lawrence, MD

## 2024-01-18 ENCOUNTER — Other Ambulatory Visit (HOSPITAL_COMMUNITY): Payer: Self-pay

## 2024-02-17 ENCOUNTER — Ambulatory Visit: Admitting: Cardiology

## 2024-02-22 DIAGNOSIS — Z09 Encounter for follow-up examination after completed treatment for conditions other than malignant neoplasm: Secondary | ICD-10-CM | POA: Diagnosis not present

## 2024-02-22 DIAGNOSIS — K2270 Barrett's esophagus without dysplasia: Secondary | ICD-10-CM | POA: Diagnosis not present

## 2024-02-22 DIAGNOSIS — K5730 Diverticulosis of large intestine without perforation or abscess without bleeding: Secondary | ICD-10-CM | POA: Diagnosis not present

## 2024-02-22 DIAGNOSIS — K317 Polyp of stomach and duodenum: Secondary | ICD-10-CM | POA: Diagnosis not present

## 2024-02-22 DIAGNOSIS — K648 Other hemorrhoids: Secondary | ICD-10-CM | POA: Diagnosis not present

## 2024-02-22 DIAGNOSIS — Z860101 Personal history of adenomatous and serrated colon polyps: Secondary | ICD-10-CM | POA: Diagnosis not present

## 2024-02-23 ENCOUNTER — Other Ambulatory Visit (HOSPITAL_COMMUNITY)

## 2024-02-24 DIAGNOSIS — K2270 Barrett's esophagus without dysplasia: Secondary | ICD-10-CM | POA: Diagnosis not present

## 2024-02-25 ENCOUNTER — Ambulatory Visit (HOSPITAL_COMMUNITY)

## 2024-03-22 ENCOUNTER — Ambulatory Visit: Payer: Self-pay | Admitting: Cardiology

## 2024-03-22 ENCOUNTER — Ambulatory Visit (HOSPITAL_COMMUNITY)
Admission: RE | Admit: 2024-03-22 | Discharge: 2024-03-22 | Disposition: A | Discharge status: Home / Self Care | Source: Ambulatory Visit | Attending: Cardiology | Admitting: Cardiology

## 2024-03-22 DIAGNOSIS — I7121 Aneurysm of the ascending aorta, without rupture: Secondary | ICD-10-CM | POA: Insufficient documentation

## 2024-03-22 DIAGNOSIS — I340 Nonrheumatic mitral (valve) insufficiency: Secondary | ICD-10-CM | POA: Diagnosis not present

## 2024-03-22 LAB — ECHOCARDIOGRAM COMPLETE
Area-P 1/2: 2.89 cm2
S' Lateral: 3.6 cm

## 2024-03-22 NOTE — Progress Notes (Signed)
 Normal pumping function of the heart. No severe heart valve abnormalities noted.  Stable mild dilation of ascending aorta.  Repeat echocardiogram in 2 years.  Thanks MJP
